# Patient Record
Sex: Female | Born: 2000 | Race: Black or African American | Hispanic: No | Marital: Single | Smoking: Never smoker
Health system: Southern US, Community
[De-identification: ages and names within clinical notes are randomized; demographics above are authoritative.]

## PROBLEM LIST (undated history)

## (undated) ENCOUNTER — Inpatient Hospital Stay (HOSPITAL_COMMUNITY): Payer: Self-pay

## (undated) ENCOUNTER — Emergency Department (HOSPITAL_COMMUNITY)

## (undated) DIAGNOSIS — B009 Herpesviral infection, unspecified: Secondary | ICD-10-CM

## (undated) DIAGNOSIS — A6000 Herpesviral infection of urogenital system, unspecified: Secondary | ICD-10-CM

## (undated) HISTORY — DX: Herpesviral infection, unspecified: B00.9

---
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
## 2019-01-24 DIAGNOSIS — Z0389 Encounter for observation for other suspected diseases and conditions ruled out: Secondary | ICD-10-CM | POA: Diagnosis not present

## 2019-01-24 DIAGNOSIS — Z3009 Encounter for other general counseling and advice on contraception: Secondary | ICD-10-CM | POA: Diagnosis not present

## 2019-01-24 DIAGNOSIS — Z1388 Encounter for screening for disorder due to exposure to contaminants: Secondary | ICD-10-CM | POA: Diagnosis not present

## 2019-01-24 DIAGNOSIS — Z113 Encounter for screening for infections with a predominantly sexual mode of transmission: Secondary | ICD-10-CM | POA: Diagnosis not present

## 2019-03-12 ENCOUNTER — Other Ambulatory Visit: Payer: Self-pay

## 2019-03-12 ENCOUNTER — Ambulatory Visit (INDEPENDENT_AMBULATORY_CARE_PROVIDER_SITE_OTHER): Payer: Medicaid Other | Admitting: Certified Nurse Midwife

## 2019-03-12 ENCOUNTER — Telehealth: Payer: Self-pay | Admitting: Certified Nurse Midwife

## 2019-03-12 ENCOUNTER — Encounter: Payer: Self-pay | Admitting: Certified Nurse Midwife

## 2019-03-12 ENCOUNTER — Other Ambulatory Visit (HOSPITAL_COMMUNITY)
Admission: RE | Admit: 2019-03-12 | Discharge: 2019-03-12 | Disposition: A | Discharge status: Home / Self Care | Payer: Medicaid Other | Source: Ambulatory Visit | Attending: Certified Nurse Midwife | Admitting: Certified Nurse Midwife

## 2019-03-12 VITALS — BP 113/78 | HR 64 | Ht 66.0 in | Wt 141.9 lb

## 2019-03-12 DIAGNOSIS — N921 Excessive and frequent menstruation with irregular cycle: Secondary | ICD-10-CM | POA: Diagnosis not present

## 2019-03-12 DIAGNOSIS — N9089 Other specified noninflammatory disorders of vulva and perineum: Secondary | ICD-10-CM

## 2019-03-12 DIAGNOSIS — N898 Other specified noninflammatory disorders of vagina: Secondary | ICD-10-CM | POA: Diagnosis not present

## 2019-03-12 MED ORDER — CLINDAMYCIN HCL 300 MG PO CAPS: 300.0000 mg | ORAL_CAPSULE | Freq: Three times a day (TID) | ORAL | 0 refills | Status: AC
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
## 2019-03-12 NOTE — Progress Notes (Signed)
GYN ENCOUNTER NOTE  Subjective:       Theresa Roberson is a 18 y.o. G0P0000 female is here for gynecologic evaluation of the following issues:  1. Breakthrough bleeding on depo x three (3) weeks 2. Intermittent thick, yellow vaginal discharge with odor x two (2) weeks 3. Vaginal bump for the last three (3) months; seen at [facility] and told it was an infected hair follicle  Denies difficulty breathing or respiratory distress, chest pain, abdominal pain, excessive vaginal bleeding, dysuria, and leg pain or swelling.    Gynecologic History  No LMP recorded. Patient has had an injection.  Contraception: Depo-Provera injections  Last Pap: N/A  Obstetric History  OB History  Gravida Para Term Preterm AB Living  0 0 0 0 0 0  SAB TAB Ectopic Multiple Live Births  0 0 0 0 0    Past Medical History:  Diagnosis Date  . Genital herpes 12/2018    No past surgical history on file.  Current Outpatient Medications on File Prior to Visit  Medication Sig Dispense Refill  . acyclovir (ZOVIRAX) 800 MG tablet Take 800 mg by mouth daily.    . medroxyPROGESTERone (DEPO-PROVERA) 150 MG/ML injection Inject into the muscle every 3 (three) months.     No current facility-administered medications on file prior to visit.     No Known Allergies  Social History   Socioeconomic History  . Marital status: Single    Spouse name: Not on file  . Number of children: Not on file  . Years of education: Not on file  . Highest education level: Not on file  Occupational History  . Not on file  Social Needs  . Financial resource strain: Not on file  . Food insecurity    Worry: Not on file    Inability: Not on file  . Transportation needs    Medical: Not on file    Non-medical: Not on file  Tobacco Use  . Smoking status: Never Smoker  . Smokeless tobacco: Never Used  Substance and Sexual Activity  . Alcohol use: Yes    Alcohol/week: 1.0 standard drinks    Types: 1 Glasses of wine per week  .  Drug use: Never  . Sexual activity: Not Currently    Birth control/protection: Injection  Lifestyle  . Physical activity    Days per week: Not on file    Minutes per session: Not on file  . Stress: Not on file  Relationships  . Social Musicianconnections    Talks on phone: Not on file    Gets together: Not on file    Attends religious service: Not on file    Active member of club or organization: Not on file    Attends meetings of clubs or organizations: Not on file    Relationship status: Not on file  . Intimate partner violence    Fear of current or ex partner: Not on file    Emotionally abused: Not on file    Physically abused: Not on file    Forced sexual activity: Not on file  Other Topics Concern  . Not on file  Social History Narrative  . Not on file    Family History  Problem Relation Age of Onset  . Breast cancer Neg Hx   . Ovarian cancer Neg Hx   . Colon cancer Neg Hx     The following portions of the patient's history were reviewed and updated as appropriate: allergies, current medications, past family history,  past medical history, past social history, past surgical history and problem list.  Review of Systems  ROS negative except as noted above. Information obtained from patient.   Objective:   BP 113/78   Pulse 64   Ht 5\' 6"  (1.676 m)   Wt 141 lb 14.4 oz (64.4 kg)   BMI 22.90 kg/m    CONSTITUTIONAL: Well-developed, well-nourished female in no acute distress.   PELVIC:  External Genitalia: Normal except for open draining (clearish white fluid) nickel sized wound  Vagina: Normal, swab collected   MUSCULOSKELETAL: Normal range of motion. No tenderness.  No cyanosis, clubbing, or edema.  Assessment:   1. Vaginal discharge  - Cervicovaginal ancillary only  2. Vulvar lump  - Anaerobic and Aerobic Culture  3. Breakthrough bleeding on depo provera  - Cervicovaginal ancillary only     Plan:   Swabs collected, see orders  Discussed home  treatment measures  Rx Clindamycin, see orders  Reviewed red flag symptoms and when to call  RTC x 3-5 days for follow up or sooner if needed   Diona Fanti, CNM [facility], [facility] 03/12/19 11:51 AM

## 2019-03-12 NOTE — Patient Instructions (Addendum)
Skin Abscess  A skin abscess is an infected area of your skin that contains pus and other material. An abscess can happen in any part of your body. Some abscesses break open (rupture) on their own. Most continue to get worse unless they are treated. The infection can spread deeper into the body and into your blood, which can make you feel sick. A skin abscess is caused by germs that enter the skin through a cut or scrape. It can also be caused by blocked oil and sweat glands or infected hair follicles. This condition is usually treated by:  Draining the pus.  Taking antibiotic medicines.  Placing a warm, wet washcloth over the abscess. Follow these instructions at home: Medicines   Take over-the-counter and prescription medicines only as told by your doctor.  If you were prescribed an antibiotic medicine, take it as told by your doctor. Do not stop taking the antibiotic even if you start to feel better. Abscess care   If you have an abscess that has not drained, place a warm, clean, wet washcloth over the abscess several times a day. Do this as told by your doctor.  Follow instructions from your doctor about how to take care of your abscess. Make sure you: ? Cover the abscess with a bandage (dressing). ? Change your bandage or gauze as told by your doctor. ? Wash your hands with soap and water before you change the bandage or gauze. If you cannot use soap and water, use hand sanitizer.  Check your abscess every day for signs that the infection is getting worse. Check for: ? More redness, swelling, or pain. ? More fluid or blood. ? Warmth. ? More pus or a bad smell. General instructions  To avoid spreading the infection: ? Do not share personal care items, towels, or hot tubs with others. ? Avoid making skin-to-skin contact with other people.  Keep all follow-up visits as told by your doctor. This is important. Contact a doctor if:  You have more redness, swelling, or pain  around your abscess.  You have more fluid or blood coming from your abscess.  Your abscess feels warm when you touch it.  You have more pus or a bad smell coming from your abscess.  You have a fever.  Your muscles ache.  You have chills.  You feel sick. Get help right away if:  You have very bad (severe) pain.  You see red streaks on your skin spreading away from the abscess. Summary  A skin abscess is an infected area of your skin that contains pus and other material.  The abscess is caused by germs that enter the skin through a cut or scrape. It can also be caused by blocked oil and sweat glands or infected hair follicles.  Follow your doctor's instructions on caring for your abscess, taking medicines, preventing infections, and keeping follow-up visits. This information is not intended to replace advice given to you by your health care provider. Make sure you discuss any questions you have with your health care provider. Document Released: 01/12/2008 Document Revised: 11/16/2018 Document Reviewed: 09/08/2017 Elsevier Patient Education  2020 Elsevier Inc. Clindamycin capsules What is this medicine? CLINDAMYCIN (KLIN da MYE sin) is a lincosamide antibiotic. It is used to treat certain kinds of bacterial infections. It will not work for colds, flu, or other viral infections. This medicine may be used for other purposes; ask your health care provider or pharmacist if you have questions. COMMON BRAND NAME(S): Cleocin What  should I tell my health care provider before I take this medicine? They need to know if you have any of these conditions:  kidney disease  liver disease  stomach problems like colitis  an unusual or allergic reaction to clindamycin, lincomycin, or other medicines, foods, dyes like tartrazine or preservatives  pregnant or trying to get pregnant  breast-feeding How should I use this medicine? Take this medicine by mouth with a full glass of water.  Follow the directions on the prescription label. You can take this medicine with food or on an empty stomach. If the medicine upsets your stomach, take it with food. Take your medicine at regular intervals. Do not take your medicine more often than directed. Take all of your medicine as directed even if you think your are better. Do not skip doses or stop your medicine early. Talk to your pediatrician regarding the use of this medicine in children. Special care may be needed. Overdosage: If you think you have taken too much of this medicine contact a poison control center or emergency room at once. NOTE: This medicine is only for you. Do not share this medicine with others. What if I miss a dose? If you miss a dose, take it as soon as you can. If it is almost time for your next dose, take only that dose. Do not take double or extra doses. What may interact with this medicine?  birth control pills  erythromycin  medicines that relax muscles for surgery  rifampin This list may not describe all possible interactions. Give your health care provider a list of all the medicines, herbs, non-prescription drugs, or dietary supplements you use. Also tell them if you smoke, drink alcohol, or use illegal drugs. Some items may interact with your medicine. What should I watch for while using this medicine? Tell your doctor or health care provider if your symptoms do not start to get better or if they get worse. This medicine may cause serious skin reactions. They can happen weeks to months after starting the medicine. Contact your health care provider right away if you notice fevers or flu-like symptoms with a rash. The rash may be red or purple and then turn into blisters or peeling of the skin. Or, you might notice a red rash with swelling of the face, lips or lymph nodes in your neck or under your arms. Do not treat diarrhea with over the counter products. Contact your doctor if you have diarrhea that lasts  more than 2 days or if it is severe and watery. What side effects may I notice from receiving this medicine? Side effects that you should report to your doctor or health care professional as soon as possible:  allergic reactions like skin rash, itching or hives, swelling of the face, lips, or tongue  dark urine  pain on swallowing  rash, fever, and swollen lymph nodes  redness, blistering, peeling or loosening of the skin, including inside the mouth  unusual bleeding or bruising  unusually weak or tired  yellowing of eyes or skin Side effects that usually do not require medical attention (report to your doctor or health care professional if they continue or are bothersome):  diarrhea  itching in the rectal or genital area  joint pain  nausea, vomiting  stomach pain This list may not describe all possible side effects. Call your doctor for medical advice about side effects. You may report side effects to FDA at 1-800-FDA-1088. Where should I keep my medicine? Keep  out of the reach of children. Store at room temperature between 20 and 25 degrees C (68 and 77 degrees F). Throw away any unused medicine after the expiration date. NOTE: This sheet is a summary. It may not cover all possible information. If you have questions about this medicine, talk to your doctor, pharmacist, or health care provider.  2020 Elsevier/Gold Standard (2018-10-26 12:02:12)

## 2019-03-12 NOTE — Progress Notes (Signed)
Patient c/o vaginal bump that comes and goes x3 months, "I was told it was a hair follicle and it would go away", intermittent thick yellow vaginal d/c with odor x2 weeks, BTB with Depo x3 weeks and will like to have STD testing done today.

## 2019-03-16 ENCOUNTER — Other Ambulatory Visit: Payer: Self-pay

## 2019-03-16 ENCOUNTER — Encounter: Payer: Self-pay | Admitting: Certified Nurse Midwife

## 2019-03-16 ENCOUNTER — Ambulatory Visit (INDEPENDENT_AMBULATORY_CARE_PROVIDER_SITE_OTHER): Payer: Medicaid Other | Admitting: Certified Nurse Midwife

## 2019-03-16 VITALS — BP 109/69 | HR 76 | Ht 66.0 in | Wt 141.7 lb

## 2019-03-16 DIAGNOSIS — L739 Follicular disorder, unspecified: Secondary | ICD-10-CM

## 2019-03-16 DIAGNOSIS — Z09 Encounter for follow-up examination after completed treatment for conditions other than malignant neoplasm: Secondary | ICD-10-CM | POA: Diagnosis not present

## 2019-03-16 LAB — CERVICOVAGINAL ANCILLARY ONLY
Candida vaginitis: NEGATIVE
Chlamydia: POSITIVE — AB
Neisseria Gonorrhea: NEGATIVE
Trichomonas: NEGATIVE

## 2019-03-16 LAB — ANAEROBIC AND AEROBIC CULTURE

## 2019-03-16 NOTE — Progress Notes (Signed)
Patient here for vaginal bump follow-up, bump has decreased in size, taking Clindamycin and feels better.

## 2019-03-16 NOTE — Patient Instructions (Signed)

## 2019-03-16 NOTE — Progress Notes (Signed)
GYN ENCOUNTER NOTE  Subjective:       Theresa Roberson is a 18 y.o. G0P0000 female here for follow up.   Reports "bump" decreasing in size. Feels better after starting Clindamycin.   Denies difficulty breathing or respiratory distress, chest pain, abdominal pain, excessive vaginal bleeding, dysuria, and leg pain or swelling.    Gynecologic History  No LMP recorded. Patient has had an injection.  Contraception: Depo-Provera injections  Last Pap: N/A   Obstetric History OB History  Gravida Para Term Preterm AB Living  0 0 0 0 0 0  SAB TAB Ectopic Multiple Live Births  0 0 0 0 0    Past Medical History:  Diagnosis Date  . Genital herpes 12/2018    No past surgical history on file.  Current Outpatient Medications on File Prior to Visit  Medication Sig Dispense Refill  . acyclovir (ZOVIRAX) 800 MG tablet Take 800 mg by mouth daily.    . clindamycin (CLEOCIN) 300 MG capsule Take 1 capsule (300 mg total) by mouth 3 (three) times daily for 10 days. 30 capsule 0  . medroxyPROGESTERone (DEPO-PROVERA) 150 MG/ML injection Inject into the muscle every 3 (three) months.     No current facility-administered medications on file prior to visit.     No Known Allergies  Social History   Socioeconomic History  . Marital status: Single    Spouse name: Not on file  . Number of children: Not on file  . Years of education: Not on file  . Highest education level: Not on file  Occupational History  . Not on file  Social Needs  . Financial resource strain: Not on file  . Food insecurity    Worry: Not on file    Inability: Not on file  . Transportation needs    Medical: Not on file    Non-medical: Not on file  Tobacco Use  . Smoking status: Never Smoker  . Smokeless tobacco: Never Used  Substance and Sexual Activity  . Alcohol use: Yes    Alcohol/week: 1.0 standard drinks    Types: 1 Glasses of wine per week  . Drug use: Never  . Sexual activity: Not Currently    Birth  control/protection: Injection  Lifestyle  . Physical activity    Days per week: Not on file    Minutes per session: Not on file  . Stress: Not on file  Relationships  . Social Musicianconnections    Talks on phone: Not on file    Gets together: Not on file    Attends religious service: Not on file    Active member of club or organization: Not on file    Attends meetings of clubs or organizations: Not on file    Relationship status: Not on file  . Intimate partner violence    Fear of current or ex partner: Not on file    Emotionally abused: Not on file    Physically abused: Not on file    Forced sexual activity: Not on file  Other Topics Concern  . Not on file  Social History Narrative  . Not on file    Family History  Problem Relation Age of Onset  . Breast cancer Neg Hx   . Ovarian cancer Neg Hx   . Colon cancer Neg Hx     The following portions of the patient's history were reviewed and updated as appropriate: allergies, current medications, past family history, past medical history, past social history, past surgical history  and problem list.  Review of Systems  ROS negative except as noted above. Information obtained from patient.   Objective:   BP 109/69   Pulse 76   Ht 5\' 6"  (1.676 m)   Wt 141 lb 11.2 oz (64.3 kg)   BMI 22.87 kg/m    CONSTITUTIONAL: Well-developed, well-nourished female in no acute distress.   PELVIC:  External Genitalia: Healing pencil eraser sized wound with clear drainage   MUSCULOSKELETAL: Normal range of motion. No tenderness.  No cyanosis, clubbing, or edema.  Recent Results (from the past 2160 hour(s))  Anaerobic and Aerobic Culture     Status: None   Collection Time: 03/12/19  2:00 PM   Specimen: Vulva; Sterile Swab   VU  Result Value Ref Range   Anaerobic Culture Final report    Result 1 Comment     Comment: No anaerobic growth in 72 hours.   Aerobic Culture Final report    Result 1 Routine flora     Comment: Light growth     Assessment:   1. Folliculitis   2. Follow up   Plan:   Lab results reviewed.   Continue home treatment measures.   RTC as needed.    Diona Fanti, CNM [facility], [facility] 03/16/19 8:06 PM

## 2019-03-17 ENCOUNTER — Other Ambulatory Visit: Payer: Self-pay | Admitting: Certified Nurse Midwife

## 2019-03-17 DIAGNOSIS — A749 Chlamydial infection, unspecified: Secondary | ICD-10-CM

## 2019-03-17 DIAGNOSIS — B9689 Other specified bacterial agents as the cause of diseases classified elsewhere: Secondary | ICD-10-CM

## 2019-03-17 MED ORDER — AZITHROMYCIN 500 MG PO TABS: 1000.0000 mg | ORAL_TABLET | Freq: Once | ORAL | 1 refills | Status: AC

## 2019-03-17 NOTE — Progress Notes (Signed)
Vaginal swab positive for BV and Chlamydia. Rx: Azithromycin, see orders. Refill for partner, no sex for one (1) week. RTC x 1 month for TOC or sooner if need. The clindamycin you are already taking should treat the BV. Encourage to active Glendo. Thanks, JML

## 2019-03-19 ENCOUNTER — Other Ambulatory Visit: Payer: Self-pay

## 2019-03-19 ENCOUNTER — Telehealth: Payer: Self-pay | Admitting: Certified Nurse Midwife

## 2019-03-19 ENCOUNTER — Ambulatory Visit: Payer: Medicaid Other

## 2019-03-19 MED ORDER — AZITHROMYCIN 500 MG PO TABS: 500.0000 mg | ORAL_TABLET | Freq: Once | ORAL | 1 refills | Status: AC

## 2019-03-19 NOTE — Telephone Encounter (Signed)
See result notes for response.

## 2019-03-19 NOTE — Telephone Encounter (Signed)
Communicable disease report faxed to [facility].

## 2019-03-19 NOTE — Telephone Encounter (Signed)
Pt stated she had STD testing last week and she is checking the status of her results. Pt is requesting a call back if possible. Please advise.

## 2019-03-19 NOTE — Progress Notes (Deleted)
Date last pap: underage. Last Depo-Provera: ***. Side Effects if any: ***. Serum HCG indicated? ***. Depo-Provera 150 mg IM given by: F Mervil Wacker,LPN. Next appointment due October 26-June 18, 2019.

## 2019-03-23 ENCOUNTER — Ambulatory Visit (INDEPENDENT_AMBULATORY_CARE_PROVIDER_SITE_OTHER): Payer: Medicaid Other | Admitting: Certified Nurse Midwife

## 2019-03-23 ENCOUNTER — Other Ambulatory Visit: Payer: Self-pay

## 2019-03-23 VITALS — BP 108/71 | HR 80 | Ht 66.0 in | Wt 142.0 lb

## 2019-03-23 DIAGNOSIS — Z3042 Encounter for surveillance of injectable contraceptive: Secondary | ICD-10-CM | POA: Diagnosis not present

## 2019-03-23 LAB — POCT URINE PREGNANCY: Preg Test, Ur: NEGATIVE

## 2019-03-23 MED ORDER — MEDROXYPROGESTERONE ACETATE 150 MG/ML IM SUSP
150.0000 mg | Freq: Once | INTRAMUSCULAR | Status: AC
  Administered 2019-03-23: 150 mg via INTRAMUSCULAR

## 2019-03-23 NOTE — Progress Notes (Signed)
Date last pap: n/a pt underage. Last Depo-Provera: Marland Kitchen Side Effects if any: none. Serum HCG indicated? n/a. Depo-Provera 150 mg IM given by: F. Berlene Dixson,LPN. Next appointment due Oct 30-Jun 22, 2019. UPT-Neg   BP 108/71   Pulse 80   Ht 5\' 6"  (1.676 m)   Wt 142 lb (64.4 kg)   BMI 22.92 kg/m

## 2019-04-17 ENCOUNTER — Ambulatory Visit (INDEPENDENT_AMBULATORY_CARE_PROVIDER_SITE_OTHER): Payer: Medicaid Other | Admitting: Certified Nurse Midwife

## 2019-04-17 ENCOUNTER — Encounter: Payer: Self-pay | Admitting: Certified Nurse Midwife

## 2019-04-17 ENCOUNTER — Other Ambulatory Visit: Payer: Self-pay

## 2019-04-17 ENCOUNTER — Other Ambulatory Visit (HOSPITAL_COMMUNITY)
Admission: RE | Admit: 2019-04-17 | Discharge: 2019-04-17 | Disposition: A | Discharge status: Home / Self Care | Payer: Medicaid Other | Source: Ambulatory Visit | Attending: Certified Nurse Midwife | Admitting: Certified Nurse Midwife

## 2019-04-17 VITALS — BP 102/63 | HR 66 | Ht 66.0 in | Wt 139.9 lb

## 2019-04-17 DIAGNOSIS — Z8619 Personal history of other infectious and parasitic diseases: Secondary | ICD-10-CM | POA: Insufficient documentation

## 2019-04-17 DIAGNOSIS — L0292 Furuncle, unspecified: Secondary | ICD-10-CM

## 2019-04-17 DIAGNOSIS — L0293 Carbuncle, unspecified: Secondary | ICD-10-CM

## 2019-04-17 MED ORDER — CLINDAMYCIN PHOSPHATE 1 % EX LOTN: TOPICAL_LOTION | Freq: Two times a day (BID) | CUTANEOUS | 0 refills | Status: DC

## 2019-04-17 NOTE — Progress Notes (Signed)
GYN ENCOUNTER NOTE  Subjective:       Theresa Roberson is a 18 y.o. G0P0000 female is here for gynecologic evaluation of the following issues:  1. Needs test of cure-history of chlamydia 2. Recurrence of "vaginal bump" approximately four (4) days ago that has since started draining  Completed antibiotics for infection.   Denies difficulty breathing or respiratory distress, chest pain, abdominal pain, excessive vaginal bleeding, dysuria, and leg pain or swelling.    Gynecologic History  No LMP recorded. Patient has had an injection.  Contraception: Depo-Provera injections  Last Pap: N/A   Obstetric History  OB History  Gravida Para Term Preterm AB Living  0 0 0 0 0 0  SAB TAB Ectopic Multiple Live Births  0 0 0 0 0    Past Medical History:  Diagnosis Date  . Genital herpes 12/2018    No past surgical history on file.  Current Outpatient Medications on File Prior to Visit  Medication Sig Dispense Refill  . acyclovir (ZOVIRAX) 800 MG tablet Take 800 mg by mouth daily.    . medroxyPROGESTERone (DEPO-PROVERA) 150 MG/ML injection Inject into the muscle every 3 (three) months.     No current facility-administered medications on file prior to visit.     No Known Allergies  Social History   Socioeconomic History  . Marital status: Single    Spouse name: Not on file  . Number of children: Not on file  . Years of education: Not on file  . Highest education level: Not on file  Occupational History  . Not on file  Social Needs  . Financial resource strain: Not on file  . Food insecurity    Worry: Not on file    Inability: Not on file  . Transportation needs    Medical: Not on file    Non-medical: Not on file  Tobacco Use  . Smoking status: Never Smoker  . Smokeless tobacco: Never Used  Substance and Sexual Activity  . Alcohol use: Yes    Alcohol/week: 1.0 standard drinks    Types: 1 Glasses of wine per week  . Drug use: Never  . Sexual activity: Not Currently     Birth control/protection: Injection  Lifestyle  . Physical activity    Days per week: Not on file    Minutes per session: Not on file  . Stress: Not on file  Relationships  . Social Musicianconnections    Talks on phone: Not on file    Gets together: Not on file    Attends religious service: Not on file    Active member of club or organization: Not on file    Attends meetings of clubs or organizations: Not on file    Relationship status: Not on file  . Intimate partner violence    Fear of current or ex partner: Not on file    Emotionally abused: Not on file    Physically abused: Not on file    Forced sexual activity: Not on file  Other Topics Concern  . Not on file  Social History Narrative  . Not on file    Family History  Problem Relation Age of Onset  . Breast cancer Neg Hx   . Ovarian cancer Neg Hx   . Colon cancer Neg Hx     The following portions of the patient's history were reviewed and updated as appropriate: allergies, current medications, past family history, past medical history, past social history, past surgical history and problem list.  Review of Systems  ROS negative except as noted above. Information obtained from patient.   Objective:   BP 102/63   Pulse 66   Ht 5\' 6"  (1.676 m)   Wt 139 lb 14.4 oz (63.5 kg)   BMI 22.58 kg/m    CONSTITUTIONAL: Well-developed, well-nourished female in no acute distress.   PELVIC:  External Genitalia: Open pencil eraser sized wound with clear drainage   MUSCULOSKELETAL: Normal range of motion. No tenderness.  No cyanosis, clubbing, or edema.  Assessment:   1. History of chlamydia  - Cervicovaginal ancillary only  2. Carbuncle and furuncle  Plan:   Vaginal swab collected, see orders.   Rx: Clindamycin lotion, see orders.   Reviewed red flag symptoms and when to call.   RTC x 1 week if symptoms worsen or fail to improve.    Diona Fanti, CNM [facility], [facility] 04/17/19 6:32  PM

## 2019-04-17 NOTE — Patient Instructions (Addendum)
Skin Abscess  A skin abscess is an infected area of your skin that contains pus and other material. An abscess can happen in any part of your body. Some abscesses break open (rupture) on their own. Most continue to get worse unless they are treated. The infection can spread deeper into the body and into your blood, which can make you feel sick. A skin abscess is caused by germs that enter the skin through a cut or scrape. It can also be caused by blocked oil and sweat glands or infected hair follicles. This condition is usually treated by:  Draining the pus.  Taking antibiotic medicines.  Placing a warm, wet washcloth over the abscess. Follow these instructions at home: Medicines   Take over-the-counter and prescription medicines only as told by your doctor.  If you were prescribed an antibiotic medicine, take it as told by your doctor. Do not stop taking the antibiotic even if you start to feel better. Abscess care   If you have an abscess that has not drained, place a warm, clean, wet washcloth over the abscess several times a day. Do this as told by your doctor.  Follow instructions from your doctor about how to take care of your abscess. Make sure you: ? Cover the abscess with a bandage (dressing). ? Change your bandage or gauze as told by your doctor. ? Wash your hands with soap and water before you change the bandage or gauze. If you cannot use soap and water, use hand sanitizer.  Check your abscess every day for signs that the infection is getting worse. Check for: ? More redness, swelling, or pain. ? More fluid or blood. ? Warmth. ? More pus or a bad smell. General instructions  To avoid spreading the infection: ? Do not share personal care items, towels, or hot tubs with others. ? Avoid making skin-to-skin contact with other people.  Keep all follow-up visits as told by your doctor. This is important. Contact a doctor if:  You have more redness, swelling, or pain  around your abscess.  You have more fluid or blood coming from your abscess.  Your abscess feels warm when you touch it.  You have more pus or a bad smell coming from your abscess.  You have a fever.  Your muscles ache.  You have chills.  You feel sick. Get help right away if:  You have very bad (severe) pain.  You see red streaks on your skin spreading away from the abscess. Summary  A skin abscess is an infected area of your skin that contains pus and other material.  The abscess is caused by germs that enter the skin through a cut or scrape. It can also be caused by blocked oil and sweat glands or infected hair follicles.  Follow your doctor's instructions on caring for your abscess, taking medicines, preventing infections, and keeping follow-up visits. This information is not intended to replace advice given to you by your health care provider. Make sure you discuss any questions you have with your health care provider. Document Released: 01/12/2008 Document Revised: 11/16/2018 Document Reviewed: 09/08/2017 Elsevier Patient Education  2020 Elsevier Inc. Folliculitis  Folliculitis is inflammation of the hair follicles. Folliculitis most commonly occurs on the scalp, thighs, legs, back, and buttocks. However, it can occur anywhere on the body. What are the causes? This condition may be caused by:  A bacterial infection (common).  A fungal infection.  A viral infection.  Contact with certain chemicals, especially oils and  tars.  Shaving or waxing.  Greasy ointments or creams applied to the skin. Long-lasting folliculitis and folliculitis that keeps coming back may be caused by bacteria. This bacteria can live anywhere on your skin and is often found in the nostrils. What increases the risk? You are more likely to develop this condition if you have:  A weakened immune system.  Diabetes.  Obesity. What are the signs or symptoms? Symptoms of this condition  include:  Redness.  Soreness.  Swelling.  Itching.  Small white or yellow, pus-filled, itchy spots (pustules) that appear over a reddened area. If there is an infection that goes deep into the follicle, these may develop into a boil (furuncle).  A group of closely packed boils (carbuncle). These tend to form in hairy, sweaty areas of the body. How is this diagnosed? This condition is diagnosed with a skin exam. To find what is causing the condition, your health care provider may take a sample of one of the pustules or boils for testing in a lab. How is this treated? This condition may be treated by:  Applying warm compresses to the affected areas.  Taking an antibiotic medicine or applying an antibiotic medicine to the skin.  Applying or bathing with an antiseptic solution.  Taking an over-the-counter medicine to help with itching.  Having a procedure to drain any pustules or boils. This may be done if a pustule or boil contains a lot of pus or fluid.  Having laser hair removal. This may be done to treat long-lasting folliculitis. Follow these instructions at home: Managing pain and swelling   If directed, apply heat to the affected area as often as told by your health care provider. Use the heat source that your health care provider recommends, such as a moist heat pack or a heating pad. ? Place a towel between your skin and the heat source. ? Leave the heat on for 20-30 minutes. ? Remove the heat if your skin turns bright red. This is especially important if you are unable to feel pain, heat, or cold. You may have a greater risk of getting burned. General instructions  If you were prescribed an antibiotic medicine, take it or apply it as told by your health care provider. Do not stop using the antibiotic even if your condition improves.  Check the irritated area every day for signs of infection. Check for: ? Redness, swelling, or pain. ? Fluid or blood. ? Warmth. ? Pus  or a bad smell.  Do not shave irritated skin.  Take over-the-counter and prescription medicines only as told by your health care provider.  Keep all follow-up visits as told by your health care provider. This is important. Get help right away if:  You have more redness, swelling, or pain in the affected area.  Red streaks are spreading from the affected area.  You have a fever. Summary  Folliculitis is inflammation of the hair follicles. Folliculitis most commonly occurs on the scalp, thighs, legs, back, and buttocks.  This condition may be treated by taking an antibiotic medicine or applying an antibiotic medicine to the skin, and applying or bathing with an antiseptic solution.  If you were prescribed an antibiotic medicine, take it or apply it as told by your health care provider. Do not stop using the antibiotic even if your condition improves.  Get help right away if you have new or worsening symptoms.  Keep all follow-up visits as told by your health care provider. This is important.  This information is not intended to replace advice given to you by your health care provider. Make sure you discuss any questions you have with your health care provider. Document Released: 10/04/2001 Document Revised: 03/04/2018 Document Reviewed: 03/04/2018 Elsevier Patient Education  2020 Elsevier Inc. Clindamycin skin solution or gel What is this medicine? CLINDAMYCIN (KLIN da MYE sin) is a lincosamide antibiotic. It is used on the skin to stop the growth of certain bacteria that cause acne. This medicine may be used for other purposes; ask your health care provider or pharmacist if you have questions. COMMON BRAND NAME(S): Cleocin T, Clinda-Derm, Clindagel, ClindaMax What should I tell my health care provider before I take this medicine? They need to know if you have any of these conditions:  diarrhea  inflammatory bowel disease  kidney or liver disease  stomach problems like colitis   an unusual or allergic reaction to clindamycin, lincomycin, other medicines, foods, dyes or preservatives  pregnant or trying to get pregnant  breast-feeding How should I use this medicine? This medicine is for external use only. Wash hands before and after use. Wash affected area and gently pat dry. Apply a thin layer of this medicine to the affected area as often as prescribed by your doctor or health care professional. Do not use skin products near the eyes, nose, or mouth. If you do get any in your eyes rinse out with plenty of cool tap water. Do not use your medicine more often than directed. Talk to your pediatrician regarding the use of this medicine in children. Special care may be needed. While this medicine may be prescribed for children as young as 12 years for selected conditions, precautions do apply. Overdosage: If you think you have taken too much of this medicine contact a poison control center or emergency room at once. NOTE: This medicine is only for you. Do not share this medicine with others. What if I miss a dose? If you miss a dose, use it as soon as you can. If it is almost time for your next dose, use only that dose. Do not use double or extra doses. What may interact with this medicine?  medicated cosmetics, including coverup preparations  other acne products including benzoyl peroxide, salicylic acid, or tretinoin  skin care products that have a high alcohol content (some shaving creams, lotions, or after shave lotion)  some skin cleansers or medicated soaps This list may not describe all possible interactions. Give your health care provider a list of all the medicines, herbs, non-prescription drugs, or dietary supplements you use. Also tell them if you smoke, drink alcohol, or use illegal drugs. Some items may interact with your medicine. What should I watch for while using this medicine? Your acne should start to get better within about 6 weeks. Complete  improvement may take longer. Tell your doctor or health care professional if you do not see any improvement. Your skin may get very dry and scale or peel. Let your doctor or health care professional know if this happens. Do not use any soothing cream or ointment without advice. What side effects may I notice from receiving this medicine? Side effects that you should report to your doctor or health care professional as soon as possible:  allergic reactions like skin rash, itching or hives, swelling of the face, lips, or tongue  diarrhea that is watery or severe  pain on swallowing  stomach pain or cramps  unusual bleeding or bruising Side effects that usually do not  require medical attention (report to your doctor or health care professional if they continue or are bothersome):  dry skin  nausea, vomiting This list may not describe all possible side effects. Call your doctor for medical advice about side effects. You may report side effects to FDA at 1-800-FDA-1088. Where should I keep my medicine? Keep out of the reach of children. Store at room temperature between 20 and 25 degrees C (68 and 77 degrees F). Do not freeze. Throw away any unused medicine after the expiration date. NOTE: This sheet is a summary. It may not cover all possible information. If you have questions about this medicine, talk to your doctor, pharmacist, or health care provider.  2020 Elsevier/Gold Standard (2013-03-01 16:17:10)

## 2019-04-17 NOTE — Progress Notes (Signed)
Patient here for TOC.  Patient c/o recurrent vaginal bump that popped 4 days ago, vaginal bump still draining, c/o thin yellow vaginal discharge and irritation x1 week

## 2019-04-18 ENCOUNTER — Telehealth: Payer: Self-pay | Admitting: Certified Nurse Midwife

## 2019-04-18 NOTE — Telephone Encounter (Signed)
The patient called and stated that she is having some issues with her medication and was advised by her pharmacy to contact the office to speed up the process. Pt requesting a call back. Please advise.

## 2019-04-19 ENCOUNTER — Telehealth: Payer: Self-pay

## 2019-04-19 NOTE — Telephone Encounter (Signed)
Returned patients call- patient states [facility] told her they sent something to [place] explaining what was needed. Nothing was sent via fax. [facility] contacted.

## 2019-04-19 NOTE — Telephone Encounter (Signed)
PA denied by [employer]. Patient needs to fail on 2 preferred agents. Message sent to ML

## 2019-04-19 NOTE — Telephone Encounter (Signed)
Attempted to contact patient to let her know ML was changing the medicine due to insurance denying the PA. Unable to leave a message due to mailbox not being setup.

## 2019-04-20 ENCOUNTER — Other Ambulatory Visit: Payer: Self-pay

## 2019-04-20 ENCOUNTER — Other Ambulatory Visit (INDEPENDENT_AMBULATORY_CARE_PROVIDER_SITE_OTHER): Payer: Medicaid Other | Admitting: Certified Nurse Midwife

## 2019-04-20 DIAGNOSIS — B379 Candidiasis, unspecified: Secondary | ICD-10-CM

## 2019-04-20 DIAGNOSIS — A549 Gonococcal infection, unspecified: Secondary | ICD-10-CM

## 2019-04-20 LAB — CERVICOVAGINAL ANCILLARY ONLY
Bacterial vaginitis: NEGATIVE
Candida vaginitis: POSITIVE — AB
Chlamydia: NEGATIVE
Neisseria Gonorrhea: POSITIVE — AB
Trichomonas: NEGATIVE

## 2019-04-20 MED ORDER — AZITHROMYCIN 500 MG PO TABS: 1000.0000 mg | ORAL_TABLET | Freq: Once | ORAL | 1 refills | Status: AC

## 2019-04-20 MED ORDER — CLINDAMYCIN HCL 300 MG PO CAPS: 300.0000 mg | ORAL_CAPSULE | Freq: Two times a day (BID) | ORAL | 0 refills | Status: DC

## 2019-04-20 NOTE — Telephone Encounter (Signed)
Rx Clindamycin 300 mg PO BID x 10 days, dispense 20, no refills. Thanks, JML

## 2019-04-23 ENCOUNTER — Telehealth: Payer: Self-pay | Admitting: Certified Nurse Midwife

## 2019-04-23 ENCOUNTER — Other Ambulatory Visit: Payer: Self-pay

## 2019-04-23 NOTE — Telephone Encounter (Signed)
The patient called stating that she needs prior authorization for her medication. The patient stated that she has been calling since last week to check the status of her provider finishing the process. Pt requesting call back. Pt was informed prior authorization for medication can take up to 72 hours at the most. Pt voiced understanding. Please advise.

## 2019-04-23 NOTE — Telephone Encounter (Signed)
Called and spoke with patient, aware Clindamycin capsules were sent to pharmacy on file 9/11.  Patient verbalized understanding.

## 2019-04-24 NOTE — Telephone Encounter (Signed)
Pt called and lvm in regards to medication issue. Issue resolved by provider. Thank you.

## 2019-05-03 ENCOUNTER — Encounter: Payer: Self-pay | Admitting: Licensed Clinical Social Worker

## 2019-05-03 ENCOUNTER — Ambulatory Visit: Payer: Medicaid Other | Admitting: Licensed Clinical Social Worker

## 2019-05-03 ENCOUNTER — Other Ambulatory Visit: Payer: Self-pay

## 2019-05-03 DIAGNOSIS — F4323 Adjustment disorder with mixed anxiety and depressed mood: Secondary | ICD-10-CM

## 2019-05-03 NOTE — Progress Notes (Signed)
Counselor Initial Adult Exam  Name: Theresa Roberson Date: 05/03/2019 MRN: [id number] DOB: 07-09-2001 PCP: Pediatrics, [facility]  Time spent: 1 hour  A biopsychosocial was completed on the Patient. Background information and current concerns were obtained during an intake in the office with the [facility] Department clinician, Leanna Battles, LCSW.  Contact information and confidentiality was discussed and appropriate consents were signed.    Reason for Visit /Presenting Problem:  Patient presents with concerns of distress/depressed mood and anxiety due to recent (April) diagnosis of Herpes. Patient reports that not only did she learn she had this STI, she was betrayed by a guy that she loved and care about, and her sister whom she confided in told all of their friends and there are people asking her about it. (PHQ-9 = 7 GAD-7 = 10).  Patient also reports "Daddy issues". She reports that she thinks she has had some depression related to her father for a long time. She shares that her father is in her life, but the don't have the close relationship that she desires.   Mental Status Exam:    Appearance:   Casual     Behavior:  Appropriate and Sharing  Motor:  Normal  Speech/Language:   Normal Rate  Affect:  Depressed  Mood:  depressed and sad  Thought process:  normal  Thought content:    WNL  Sensory/Perceptual disturbances:    WNL  Orientation:  oriented to person, place and time/date  Attention:  Good  Concentration:  Good  Memory:  WNL  Fund of knowledge:   Good  Insight:    Good  Judgment:   Good  Impulse Control:  Good   Reported Symptoms:  Anhedonia, Sleep disturbance, Isolation and withdrawal, Fatigue and depressed mood, anxiety, anxious thought patterns  Risk Assessment: Danger to Self:  No Self-injurious Behavior: No Danger to Others: No Duty to Warn:no Physical Aggression / Violence:No  Access to Firearms a concern: No  Gang Involvement:No  Patient / guardian  was educated about steps to take if suicide or homicide risk level increases between visits: yes While future psychiatric events cannot be accurately predicted, the patient does not currently require acute inpatient psychiatric care and does not currently meet [facility] involuntary commitment criteria.  Substance Abuse History: Current substance abuse: Yes  marijuana, but has recently decreased use.      Past Psychiatric History:   No previous psychological problems have been observed. And patient denies any family history of mental health issues.  History of Psych Hospitalization: No   Abuse History: Victim of No. Report needed: No. Victim of Neglect:No. Perpetrator of No  Witness / Exposure to Domestic Violence: No   Protective Services Involvement: No  Witness to [employer] Violence:  No   Family History:  Family History  Problem Relation Age of Onset  . Breast cancer Neg Hx   . Ovarian cancer Neg Hx   . Colon cancer Neg Hx     Social History:  Social History   Socioeconomic History  . Marital status: Single    Spouse name: Not on file  . Number of children: 0  . Years of education: 38  . Highest education level: High school graduate  Occupational History  . Not on file  Social Needs  . Financial resource strain: Not on file  . Food insecurity    Worry: Not on file    Inability: Not on file  . Transportation needs    Medical: Not on file  Non-medical: Not on file  Tobacco Use  . Smoking status: Never Smoker  . Smokeless tobacco: Never Used  Substance and Sexual Activity  . Alcohol use: Yes    Alcohol/week: 1.0 standard drinks    Types: 1 Glasses of wine per week  . Drug use: Never  . Sexual activity: Not Currently    Birth control/protection: Injection  Lifestyle  . Physical activity    Days per week: Not on file    Minutes per session: Not on file  . Stress: Not on file  Relationships  . Social connections    Talks on phone: More than three  times a week    Gets together: More than three times a week    Attends religious service: Not on file    Active member of club or organization: No    Attends meetings of clubs or organizations: Never    Relationship status: Never married  Other Topics Concern  . Not on file  Social History Narrative   Patient lives with her mom and her two younger brothers. She has a very close relationship with her mom and god father. Her dad is in her life but she doesn't have the close relationship she desires to have with him. Patient has a lot of supportive friendships. She has one best friend and was best friends with one of her sisters, but they are no longer talking at this time.     Living situation: the patient lives with their family, including her mom and her two younger brothers   Sexual Orientation:  Straight  Relationship Status: single   Garment/textile technologist; friends parents  Museum/gallery curator Stress:  No   Income/Employment/Disability: Employment  Armed forces logistics/support/administrative officer: No   Educational History: Education: student currently attending [employer]   Religion/Sprituality/World View:   Christian   Any cultural differences that may affect / interfere with treatment:  not applicable   Recreation/Hobbies: NA   Stressors:Other: being diagnosed with herpes, relationship with dad  Strengths:  Supportive Relationships, Family, Able to Communicate Effectively and stable housing, own transportation, attending college and working   Barriers:  No    Legal History: Pending legal issue / charges: The patient has no significant history of legal issues. History of legal issue / charges: No  Medical History/Surgical History:reviewed Past Medical History:  Diagnosis Date  . Genital herpes 12/2018    No past surgical history on file.  Medications: Current Outpatient Medications  Medication Sig Dispense Refill  . acyclovir (ZOVIRAX) 800 MG tablet Take 800 mg by mouth daily.    . clindamycin (CLEOCIN T) 1 %  lotion Apply topically 2 (two) times daily. 60 mL 0  . clindamycin (CLEOCIN) 300 MG capsule Take 1 capsule (300 mg total) by mouth 2 (two) times daily. For ten days 20 capsule 0  . medroxyPROGESTERone (DEPO-PROVERA) 150 MG/ML injection Inject into the muscle every 3 (three) months.     No current facility-administered medications for this visit.     No Known Allergies  Ms. Grainne Knights is a 18 year old female with no reported history of mental health diagnosis. Patient currently presents with depressive symptoms and anxiety that developed due to recent stressors. She reports depressed mood, anhedonia, sleep disturbance, low energy, difficulties concentrating, and passive suicidal ideations. Although patient endorses these vague suicidal ideations, she denies any current plan, intent, or means to harm herself. She also describes feeling anxious and worried. (PHQ-9 = 7 GAD-7 = 10). Patient reports that these symptoms impact her functioning in  multiple life domains.   Due to the above symptoms and patient's reported history, patient is diagnosed with Adjustment Disorder, With mixed anxiety and depressed mood. Continued mental health treatment is needed to address patient's symptoms and monitor her safety and stability. Patient is recommended for continued outpatient therapy to reduce her symptoms and improve her coping strategies.    There is no acute risk for suicide or violence at this time.  While future psychiatric events cannot be accurately predicted, the patient does not require acute inpatient psychiatric care and [facility] not [facility] criteria.   Diagnoses:    ICD-10-CM   1. Adjustment disorder with mixed anxiety and depressed mood  F43.23     Plan of Care:  Goal to understand issues, feeling more self love, move past end of relationships with ex, getting over issues with dad  Provided psychoeducation on CBTs.  Patient likely to benefit from CBTs  to increase behaviors aligning with values, increase self-care, including sleep hygiene, address unhelpful thinking patterns/beliefs about self and others, and increase coping skills to manage emotions. Patient is in agreement with weekly Zoom sessions at this time.   Interpreter used: NA   Kathreen CosierAmanda Stacy Sailer, LCSW

## 2019-05-09 ENCOUNTER — Ambulatory Visit: Payer: Medicaid Other | Admitting: Licensed Clinical Social Worker

## 2019-05-09 DIAGNOSIS — F4323 Adjustment disorder with mixed anxiety and depressed mood: Secondary | ICD-10-CM

## 2019-05-09 NOTE — Progress Notes (Signed)
Counselor/Therapist Progress Note  Patient ID: Theresa Roberson, MRN: [id number],    Date: 05/09/2019  Time Spent: 50 minutes   Treatment Type: Psychotherapy  Reported Symptoms: Anhedonia, Isolation and withdrawal and low mood, mild anxious thoughts  Mental Status Exam:  Appearance:   Casual     Behavior:  Appropriate and Sharing  Motor:  Normal  Speech/Language:   Normal Rate  Affect:  Appropriate  Mood:  normal  Thought process:  normal  Thought content:    WNL  Sensory/Perceptual disturbances:    WNL  Orientation:  oriented to person, place and time/date  Attention:  Good  Concentration:  Good  Memory:  WNL  Fund of knowledge:   Good  Insight:    Good  Judgment:   Good  Impulse Control:  Good   Risk Assessment: Danger to Self:  No Self-injurious Behavior: No Danger to Others: No Duty to Warn:no Physical Aggression / Violence:No  Access to Firearms a concern: No  Gang Involvement:No   Subjective: Patient was engaged and cooperative throughout the session using time effectively to discuss thoughts, feelings, and treatment planning.  Patient voices continued motivation for treatment and understanding of  . Patient is likely to benefit from future treatment because she remains motivated to decrease mood and anxiety symptoms.    Interventions: Cognitive Behavioral Therapy and Motivational Interviewing  Established psychological safety.  Checked in with patient and reviewed previous session, including assessment and goal of treatment. Reviewed CBTs. Explored patient's goal of treatment and worked collaboratively to develop CBTs treatment plan. Used MI to assist patient in developing plan to begin exercising. Discussed the importance of engagement pleasurable activities - encouraging patient to engage in positive peer relationships. Provided support through active listening, validation of feelings, and highlighted patient's strengths.  Diagnosis:   ICD-10-CM   1. Adjustment  disorder with mixed anxiety and depressed mood  F43.23     Plan: Goal to understand issues, feeling more self love, move past end of relationships with ex, getting over issues with dad.  Treatment Target: Understand the relationship between thoughts, emotions, and behaviors  - Psychoeducation on CBT model   - Teach the connection between thoughts, emotions, and behaviors  - Understand thoughts, emotions, and behaviors  Treatment Target: Increase realistic balanced thinking  - Explore patient's thoughts, beliefs, automatic thoughts, assumptions  - Identify and replace thinking that leads to depression - Process distress and allow for emotional release  - Questioning and challenging thoughts - Cognitive reappraisal   Treatment Target: Increase pleasurable and meaningful activities - Behavioral activation      - Provide psychoeducation on Behavior Activation  - Values clarification  - Identify activities/goals aligning with values - Discuss barriers and problem solve     Interpreter used: NA   Milton Ferguson, LCSW

## 2019-05-21 ENCOUNTER — Ambulatory Visit: Payer: Medicaid Other | Admitting: Licensed Clinical Social Worker

## 2019-05-29 ENCOUNTER — Ambulatory Visit: Payer: Medicaid Other | Admitting: Licensed Clinical Social Worker

## 2019-05-29 DIAGNOSIS — F4323 Adjustment disorder with mixed anxiety and depressed mood: Secondary | ICD-10-CM

## 2019-05-29 NOTE — Progress Notes (Signed)
Counselor/Therapist Progress Note  Patient ID: Theresa Roberson, MRN: [id number],    Date: 05/29/2019  Time Spent: 45 minutes   Treatment Type: Psychotherapy  Reported Symptoms: Isolation and withdrawal, Fatigue and some imporvement in mood since last session. Increase in marijuana usage.   Mental Status Exam:  Appearance:   Casual     Behavior:  Appropriate and Sharing  Motor:  Normal  Speech/Language:   Normal Rate  Affect:  Appropriate  Mood:  euthymic  Thought process:  normal  Thought content:    WNL  Sensory/Perceptual disturbances:    WNL  Orientation:  oriented to person, place and time/date  Attention:  Good  Concentration:  Good  Memory:  WNL  Fund of knowledge:   Good  Insight:    Good  Judgment:   Good  Impulse Control:  Good   Risk Assessment: Danger to Self:  No Self-injurious Behavior: No Danger to Others: No Duty to Warn:no Physical Aggression / Violence:No  Access to Firearms a concern: No  Gang Involvement:No   Subjective: Patient was engaged and cooperative throughout the session using time effectively to discuss thoughts and feelings. Patient voices continued motivation for treatment and understanding of mood and anxiety issues related to challenging life events. Patient is in agreement with treatment plan and is likely to benefit from future treatment because she remains motivated to decrease mood and anxiety symptoms and reports some  benefit of sessions in addressing these symptoms.    Interventions: Cognitive Behavioral Therapy and Motivational Interviewing  Established psychological safety. Checked in with patient and discussed current mood and psychosocial stressors. Used MI to discuss patient's increase in marijuana usage, providing psychoeducation on the negative effects of use, and rolling with resitance. Engaged patient in processing mood improvement due to increased positive behaviors, including engaging in activities with positive peers, with  mom, and interaction with her dad. Checked in on follow through with exercise, discussed the benefits of exercise and encouraged patient to establish a plan. Discussed values in regards to romantic relationships.  Engaged patient in reviewing treatment plan. Provided support through active listening, validation of feelings, and highlighted patient's strengths.  Diagnosis:   ICD-10-CM   1. Adjustment disorder with mixed anxiety and depressed mood  F43.23     Plan: Goal to understand issues, feeling more self love, move past end of relationships with ex, getting over issues with dad.  Treatment Target: Understand the relationship between thoughts, emotions, and behaviors   Psychoeducation on CBT model    Teach the connection between thoughts, emotions, and behaviors   Understand thoughts, emotions, and behaviors  Treatment Target: Increase realistic balanced thinking   Explore patient's thoughts, beliefs, automatic thoughts, assumptions   Identify and replace thinking that leads to depression  Process distress and allow for emotional release   Questioning and challenging thoughts  Cognitive reappraisal   Treatment Target: Increase pleasurable and meaningful activities - Behavioral activation    Provide psychoeducation on Behavior Activation   Values clarification   Identify activities/goals aligning with values  Discuss barriers and problem solve   Interpreter used: NA   Milton Ferguson, LCSW
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
## 2019-06-08 ENCOUNTER — Ambulatory Visit: Payer: Medicaid Other

## 2019-06-12 ENCOUNTER — Ambulatory Visit: Payer: Medicaid Other | Admitting: Licensed Clinical Social Worker

## 2019-06-15 ENCOUNTER — Ambulatory Visit: Payer: Medicaid Other

## 2019-06-21 ENCOUNTER — Telehealth: Payer: Self-pay | Admitting: Licensed Clinical Social Worker

## 2019-06-21 NOTE — Telephone Encounter (Signed)
Attempted call to follow up on missed appointment. VM is not set up. No further follow up will be made at this time.

## 2019-06-21 NOTE — Telephone Encounter (Signed)
-----   Message from Milton Ferguson, [place] sent at 06/12/2019  1:51 PM EST ----- Regarding: follow up with patient due to no show DNKA. Call to follow up.

## 2019-06-25 ENCOUNTER — Ambulatory Visit: Payer: Medicaid Other | Admitting: Licensed Clinical Social Worker

## 2019-06-25 DIAGNOSIS — F4323 Adjustment disorder with mixed anxiety and depressed mood: Secondary | ICD-10-CM

## 2019-06-25 NOTE — Progress Notes (Signed)
Counselor/Therapist Progress Note  Patient ID: Theresa Roberson, MRN: [id number],    Date: 06/25/2019  Time Spent: 20 minutes   Treatment Type: Psychotherapy  Reported Symptoms: overall mood improvement with infrequent times of low mood and anxious thoughts secondary to recent stressor event   Mental Status Exam:  Appearance:   NA     Behavior:  Appropriate and Sharing  Motor:  Normal  Speech/Language:   Normal Rate  Affect:  Appropriate  Mood:  normal  Thought process:  normal  Thought content:    WNL  Sensory/Perceptual disturbances:    WNL  Orientation:  oriented to person, place, time/date and situation  Attention:  Good  Concentration:  Good  Memory:  WNL  Fund of knowledge:   Good  Insight:    Good  Judgment:   Good  Impulse Control:  Good   Risk Assessment: Danger to Self:  No Self-injurious Behavior: No Danger to Others: No Duty to Warn:no Physical Aggression / Violence:No  Access to Firearms a concern: No  Gang Involvement:No   Subjective: Patient was engaged and cooperative throughout the session using time effectively to discuss thoughts and feelings. Patient voices continued motivation for treatment and understanding of mood and anxiety issues related to stressors. Patient is likely to benefit from future treatment because she remains motivated to manage symptoms and reports benefit of regular sessions in addressing mood and anxiety related symptoms.   Interventions: Cognitive Behavioral Therapy  Established psychological safety. Provided supportive space encouraging emotional release and processing of current psychosocial stressors. Explored patient's perception of difficulties with coworker, identifying origins of these feelings and validating her feelings. Discussed patient's desire to live within her values and ways to accept and manage feelings of anger and anxiety while continuing to act consistent with her values. Encouraged continued self-care, including  positive social interactions, and continued regular workout schedule. Provided support through active listening, validation of feelings, and highlighted patient's strengths.   Diagnosis:   ICD-10-CM   1. Adjustment disorder with mixed anxiety and depressed mood  F43.23     Plan: Goal to understand issues, feeling more self love, move past end of relationships with ex, getting over issues with dad.  Treatment Target: Understand the relationship between thoughts, emotions, and behaviors   Psychoeducation on CBT model   Teach the connection between thoughts, emotions, and behaviors   Understand thoughts, emotions, and behaviors  Treatment Target: Increase realistic balanced thinking   Explore patient's thoughts, beliefs, automatic thoughts, assumptions   Identify and replace thinking that leads to depression  Process distress and allow for emotional release   Questioning and challenging thoughts  Cognitive reappraisal   Treatment Target: Increasepleasurable and meaningfulactivities -Behavioral activation                                     Provide psychoeducation on Behavior Activation   Values clarification   Identify activities/goals aligning with values  Discuss barriers and problem solve  Interpreter used: NA   Milton Ferguson, LCSW

## 2019-06-28 ENCOUNTER — Ambulatory Visit (LOCAL_COMMUNITY_HEALTH_CENTER): Payer: Medicaid Other | Admitting: Advanced Practice Midwife

## 2019-06-28 ENCOUNTER — Encounter: Payer: Self-pay | Admitting: Advanced Practice Midwife

## 2019-06-28 ENCOUNTER — Other Ambulatory Visit: Payer: Self-pay

## 2019-06-28 VITALS — BP 106/66 | Ht 65.0 in | Wt 138.0 lb

## 2019-06-28 DIAGNOSIS — N760 Acute vaginitis: Secondary | ICD-10-CM

## 2019-06-28 DIAGNOSIS — B9689 Other specified bacterial agents as the cause of diseases classified elsewhere: Secondary | ICD-10-CM | POA: Diagnosis not present

## 2019-06-28 DIAGNOSIS — Z1388 Encounter for screening for disorder due to exposure to contaminants: Secondary | ICD-10-CM | POA: Diagnosis not present

## 2019-06-28 DIAGNOSIS — Z3042 Encounter for surveillance of injectable contraceptive: Secondary | ICD-10-CM

## 2019-06-28 DIAGNOSIS — Z3009 Encounter for other general counseling and advice on contraception: Secondary | ICD-10-CM | POA: Diagnosis not present

## 2019-06-28 DIAGNOSIS — Z113 Encounter for screening for infections with a predominantly sexual mode of transmission: Secondary | ICD-10-CM | POA: Diagnosis not present

## 2019-06-28 DIAGNOSIS — Z30013 Encounter for initial prescription of injectable contraceptive: Secondary | ICD-10-CM | POA: Diagnosis not present

## 2019-06-28 DIAGNOSIS — B009 Herpesviral infection, unspecified: Secondary | ICD-10-CM | POA: Insufficient documentation

## 2019-06-28 DIAGNOSIS — Z0389 Encounter for observation for other suspected diseases and conditions ruled out: Secondary | ICD-10-CM | POA: Diagnosis not present

## 2019-06-28 LAB — WET PREP FOR TRICH, YEAST, CLUE
Trichomonas Exam: NEGATIVE
Yeast Exam: NEGATIVE

## 2019-06-28 MED ORDER — MEDROXYPROGESTERONE ACETATE 150 MG/ML IM SUSP
150.0000 mg | Freq: Once | INTRAMUSCULAR | Status: AC
  Administered 2019-06-28: 150 mg via INTRAMUSCULAR

## 2019-06-28 MED ORDER — METRONIDAZOLE 500 MG PO TABS: 500.0000 mg | ORAL_TABLET | Freq: Two times a day (BID) | ORAL | 0 refills | Status: AC

## 2019-06-28 NOTE — Progress Notes (Signed)
Pt here for Depo and STD screening and desires blood work for HIV and syphillis. Pt reports that she got Depo ~3 months ago at [facility].Ronny Bacon, RN

## 2019-06-28 NOTE — Progress Notes (Signed)
   [facility] problem visit  [place] Department  Subjective:  Theresa Roberson is a 18 y.o.SBF nullip nonsmoker being seen today for DMPA and STD screen  Chief Complaint  Patient presents with  . Contraception    depo  . SEXUALLY TRANSMITTED DISEASE    STD screening    HPI Last DMPA at [facility] "3 months ago".  Last sex 06/24/19 without condom.  Last physical 06/30/2018.  C/o fishy increased yellow-brown d/c after last sex with vaginal irritation  Does the patient have a current or past history of drug use? No   No components found for: HCV]   Health Maintenance Due  Topic Date Due  . HIV Screening  12/15/2014  . TETANUS/TDAP  12/15/2018  . INFLUENZA VACCINE  03/10/2019    ROS  The following portions of the patient's history were reviewed and updated as appropriate: allergies, current medications, past family history, past medical history, past social history, past surgical history and problem list. Problem list updated.   See flowsheet for other program required questions.  Objective:   Vitals:   06/28/19 1403  BP: 106/66  Weight: 138 lb (62.6 kg)  Height: 5\' 5"  (1.651 m)    Physical Exam Constitutional:      Appearance: Normal appearance.  Abdominal:     Palpations: Abdomen is soft.     Comments: Soft without tenderness  Genitourinary:    General: Normal vulva.     Exam position: Lithotomy position.     Vagina: Vaginal discharge (brown-red sl malodorous leukorrhea, ph>4.5) present.     Cervix: Normal.     Uterus: Normal.      Adnexa: Right adnexa normal and left adnexa normal.     Rectum: Normal.  Neurological:     Mental Status: She is alert.       Assessment and Plan:  Theresa Roberson is a 18 y.o. female presenting to the [facility] Department for a Women's Health problem visit  1. Family planning ROI for last DMPA at [facility] please  2. Screening examination for venereal disease Treat wet mount  per standing orders Immunization nurse consult - WET PREP FOR [place], YEAST, CLUE - Syphilis Serology, [facility] - HIV [place] [facility] - Chlamydia/Gonorrhea [facility]  3. Encounter for surveillance of injectable contraceptive If last DMPA less than 16 wks ago, may have DMPA 150 mg IM x1  4. Herpes Taking Acyclovir daily     Return in about 3 months (around 09/28/2019) for 11-13 wk DMPA.  Future Appointments  Date Time Provider [place]  07/09/2019 11:00 AM Milton Ferguson, LCSW [facility] None    Herbie Saxon, CNM

## 2019-06-29 NOTE — Progress Notes (Signed)
Wet mount reviewed and pt treated for BV per standing order and per Ola Spurr, CNM verbal order. Phone call to [facility] as pt reports is fine with [place] contacting them to find out last Depo date, and per [facility] pt's last Depo was 03/23/2019, so pt is 13 weeks and 6 days post last Depo. Pt received Depo 150mg  IM per provider order and pt tolerated well. Provider orders completed.Ronny Bacon, RN

## 2019-07-09 ENCOUNTER — Ambulatory Visit: Payer: Medicaid Other | Admitting: Licensed Clinical Social Worker

## 2019-07-22 ENCOUNTER — Emergency Department
Admission: EM | Admit: 2019-07-22 | Discharge: 2019-07-23 | Disposition: A | Discharge status: Home / Self Care | Payer: Medicaid Other | Attending: Emergency Medicine | Admitting: Emergency Medicine

## 2019-07-22 ENCOUNTER — Other Ambulatory Visit: Payer: Self-pay

## 2019-07-22 DIAGNOSIS — Z79899 Other long term (current) drug therapy: Secondary | ICD-10-CM | POA: Diagnosis not present

## 2019-07-22 DIAGNOSIS — Z20822 Contact with and (suspected) exposure to covid-19: Secondary | ICD-10-CM

## 2019-07-22 DIAGNOSIS — U071 COVID-19: Secondary | ICD-10-CM | POA: Insufficient documentation

## 2019-07-22 DIAGNOSIS — R519 Headache, unspecified: Secondary | ICD-10-CM | POA: Diagnosis not present

## 2019-07-22 DIAGNOSIS — B349 Viral infection, unspecified: Secondary | ICD-10-CM | POA: Diagnosis not present

## 2019-07-22 DIAGNOSIS — Z1152 Encounter for screening for COVID-19: Secondary | ICD-10-CM

## 2019-07-22 DIAGNOSIS — Z20828 Contact with and (suspected) exposure to other viral communicable diseases: Secondary | ICD-10-CM

## 2019-07-22 MED ORDER — ACETAMINOPHEN 325 MG PO TABS
650.0000 mg | ORAL_TABLET | Freq: Once | ORAL | Status: AC
  Administered 2019-07-22: 650 mg via ORAL
  Filled 2019-07-22: qty 2

## 2019-07-22 MED ORDER — ACETAMINOPHEN 500 MG PO TABS: 500.0000 mg | ORAL_TABLET | Freq: Four times a day (QID) | ORAL | 0 refills | Status: AC | PRN

## 2019-07-22 MED ORDER — KETOROLAC TROMETHAMINE 30 MG/ML IJ SOLN
30.0000 mg | Freq: Once | INTRAMUSCULAR | Status: AC
  Administered 2019-07-22: 30 mg via INTRAMUSCULAR
  Filled 2019-07-22: qty 1

## 2019-07-22 NOTE — ED Provider Notes (Signed)
[facility] Emergency Department Provider Note  ____________________________________________  Time seen: Approximately 11:04 PM  I have reviewed the triage vital signs and the nursing notes.   HISTORY  Chief Complaint Headache    HPI Theresa Roberson is a 18 y.o. female presents to the emergency department for evaluation of headache and fatigue today.  Patient states that she woke up this morning with a headache.  She is kind had a mild headache all day.  She also has some mild rhinorrhea and an occasional cough. She had an episode of diarrhea this morning. She has kind of been tired and weak all day.  She thought maybe she had a fever today but her mother checked and said that she did not.  She took some ibuprofen.  She also drank some tea and chicken noodle soup today.  Patient just found out that her grandmother tested positive for Covid over the holidays.  She has 2 coworkers that are also positive.  Patient went to urgent care today for a Covid test but is concerned that the test will take a long time.  She came to the emergency department today for a faster test.  All of her siblings are getting tested as well.  She attends [employer].  Her vaccinations are up-to-date.  She denies any confusion, dizziness, neck pain, shortness of breath, chest pain, vomiting, abdominal pain.  Past Medical History:  Diagnosis Date  . Genital herpes 12/2018    Patient Active Problem List   Diagnosis Date Noted  . Herpes 06/28/2019  . History of chlamydia 04/17/2019    History reviewed. No pertinent surgical history.  Prior to Admission medications   Medication Sig Start Date End Date Taking? Authorizing Provider  acetaminophen (TYLENOL) 500 MG tablet Take 1 tablet (500 mg total) by mouth every 6 (six) hours as needed. 07/22/19   Laban Emperor, PA-C  acyclovir (ZOVIRAX) 800 MG tablet Take 800 mg by mouth daily. 03/07/19   [provider]   clindamycin (CLEOCIN T) 1 % lotion Apply topically 2 (two) times daily. Patient not taking: Reported on 06/28/2019 04/17/19   Diona Fanti, CNM  clindamycin (CLEOCIN) 300 MG capsule Take 1 capsule (300 mg total) by mouth 2 (two) times daily. For ten days Patient not taking: Reported on 06/28/2019 04/20/19   Diona Fanti, CNM  medroxyPROGESTERone (DEPO-PROVERA) 150 MG/ML injection Inject into the muscle every 3 (three) months. 09/15/18   [provider]    Allergies Patient has no known allergies.  Family History  Problem Relation Age of Onset  . Breast cancer Neg Hx   . Ovarian cancer Neg Hx   . Colon cancer Neg Hx     Social History Social History   Tobacco Use  . Smoking status: Never Smoker  . Smokeless tobacco: Never Used  Substance Use Topics  . Alcohol use: Not Currently    Alcohol/week: 1.0 standard drinks    Types: 1 Glasses of wine per week  . Drug use: Never     Review of Systems  Constitutional: No fever/chills Eyes: No visual changes. No discharge. ENT: Positive for intermittent rhinorrhea. Negative for congestion. Cardiovascular: No chest pain. Respiratory: Positive for intermittent cough. No SOB. Gastrointestinal: No abdominal pain.  No nausea, no vomiting.  No diarrhea.  No constipation. Musculoskeletal: Negative for musculoskeletal pain. Skin: Negative for rash, abrasions, lacerations, ecchymosis. Neurological: Positive for mild headache.   ____________________________________________   PHYSICAL EXAM:  VITAL SIGNS: ED Triage Vitals  Enc Vitals Group     BP 07/22/19 2203 125/72     Pulse Rate 07/22/19 2203 (!) 102     Resp 07/22/19 2203 16     Temp 07/22/19 2203 100 F (37.8 C)     Temp Source 07/22/19 2203 Oral     SpO2 07/22/19 2203 100 %     Weight 07/22/19 2202 140 lb (63.5 kg)     Height 07/22/19 2202 5\' 6"  (1.676 m)     Head Circumference --      Peak Flow --      Pain Score 07/22/19 2208 7     Pain  Loc --      Pain Edu? --      Excl. in GC? --      Constitutional: Alert and oriented. Well appearing and in no acute distress. Eyes: Conjunctivae are normal. PERRL. EOMI. No discharge. Head: Atraumatic. ENT: No frontal and maxillary sinus tenderness.      Ears: Tympanic membranes are pink. No discharge.      Nose: Minimal congestion/rhinnorhea.      Mouth/Throat: Mucous membranes are moist. Oropharynx non-erythematous. Tonsils not enlarged. No exudates. Uvula midline. Neck: No stridor.   Hematological/Lymphatic/Immunilogical: No cervical lymphadenopathy. Cardiovascular: Normal rate, regular rhythm.  Good peripheral circulation. Respiratory: Normal respiratory effort without tachypnea or retractions. Lungs CTAB. Good air entry to the bases with no decreased or absent breath sounds. Gastrointestinal: Bowel sounds 4 quadrants. Soft and nontender to palpation. No guarding or rigidity. No palpable masses. No distention. Musculoskeletal: Full range of motion to all extremities. No gross deformities appreciated. Neurologic:  Normal speech and language. No gross focal neurologic deficits are appreciated.  Skin:  Skin is warm, dry and intact. No rash noted. Psychiatric: Mood and affect are normal. Speech and behavior are normal. Patient exhibits appropriate insight and judgement.   ____________________________________________   LABS (all labs ordered are listed, but only abnormal results are displayed)  Labs Reviewed  SARS CORONAVIRUS 2 (TAT 6-24 HRS)   ____________________________________________  EKG   ____________________________________________  RADIOLOGY  No results found.  ____________________________________________    PROCEDURES  Procedure(s) performed:    Procedures    Medications  ketorolac (TORADOL) 30 MG/ML injection 30 mg (30 mg Intramuscular Given 07/22/19 2305)  acetaminophen (TYLENOL) tablet 650 mg (650 mg Oral Given 07/22/19 2305)      ____________________________________________   INITIAL IMPRESSION / ASSESSMENT AND PLAN / ED COURSE  Pertinent labs & imaging results that were available during my care of the patient were reviewed by me and considered in my medical decision making (see chart for details).  Review of the [place] CSRS was performed in accordance of the [facility] prior to dispensing any controlled drugs.     Patient's diagnosis is consistent with viral illness. Vital signs and exam are reassuring.  Covid test in process.  Patient is drinking water in the emergency room.  Patient appears well and is staying well hydrated.  Patient was given Tylenol and Toradol for her headache.  Headache completely resolved in the emergency department with medications.  Patient feels comfortable going home. Patient will be discharged home with prescriptions for tylenol. Patient is to follow up with primary care as needed or otherwise directed. Patient is given ED precautions to return to the ED for any worsening or new symptoms.  Theresa Roberson was evaluated in Emergency Department on 07/23/2019 for the symptoms described in the history of present illness. She was evaluated in the context of the global  COVID-19 pandemic, which necessitated consideration that the patient might be at risk for infection with the SARS-CoV-2 virus that causes COVID-19. Institutional protocols and algorithms that pertain to the evaluation of patients at risk for COVID-19 are in a state of rapid change based on information released by regulatory bodies including the CDC and federal and state organizations. These policies and algorithms were followed during the patient's care in the ED.   ____________________________________________  FINAL CLINICAL IMPRESSION(S) / ED DIAGNOSES  Final diagnoses:  Viral illness  Encounter for screening laboratory testing for COVID-19 virus      NEW MEDICATIONS STARTED DURING THIS VISIT:  ED Discharge Orders          Ordered    acetaminophen (TYLENOL) 500 MG tablet  Every 6 hours PRN     07/22/19 2356              This chart was dictated using voice recognition software/Dragon. Despite best efforts to proofread, errors can occur which can change the meaning. Any change was purely unintentional.    Enid DerryWagner, Mystie Ormand, PA-C 07/23/19 0011    Chesley NoonJessup, Charles, MD 07/23/19 2015

## 2019-07-22 NOTE — ED Triage Notes (Signed)
Patient to ED for headache and not feeling well. States her grandmother tested positive for Covid and was around them for THanksgiving. Patient complains of headache and not feeling well despite drinking tea, and chicken noodle soup. Patient without respiratory distress. Able to speak in complete sentences without difficulty.

## 2019-07-22 NOTE — ED Notes (Signed)
Pt drinking water at this time.

## 2019-07-23 ENCOUNTER — Telehealth: Payer: Self-pay | Admitting: Emergency Medicine

## 2019-07-23 LAB — SARS CORONAVIRUS 2 (TAT 6-24 HRS): SARS Coronavirus 2: POSITIVE — AB

## 2019-07-23 NOTE — Telephone Encounter (Signed)
caled patient to inform of positive covid.  Explained isolation and quarantine guidelines.

## 2019-08-08 DIAGNOSIS — Z1159 Encounter for screening for other viral diseases: Secondary | ICD-10-CM | POA: Diagnosis not present

## 2019-08-08 DIAGNOSIS — Z03818 Encounter for observation for suspected exposure to other biological agents ruled out: Secondary | ICD-10-CM | POA: Diagnosis not present

## 2019-09-13 ENCOUNTER — Ambulatory Visit: Payer: Medicaid Other

## 2019-09-13 ENCOUNTER — Other Ambulatory Visit: Payer: Self-pay

## 2019-09-13 ENCOUNTER — Other Ambulatory Visit: Payer: Self-pay | Admitting: Physician Assistant

## 2019-09-13 DIAGNOSIS — Z3042 Encounter for surveillance of injectable contraceptive: Secondary | ICD-10-CM

## 2019-09-13 MED ORDER — MEDROXYPROGESTERONE ACETATE 150 MG/ML IM SUSP: 150.0000 mg | INTRAMUSCULAR | Status: AC

## 2019-09-13 NOTE — Progress Notes (Signed)
Per chart, last RP 06/2018.  CBE due 2021 and pap due 2022.  Provided that patient desires to continue with Depo and BP is normal, may continue with Depo.

## 2019-09-14 ENCOUNTER — Ambulatory Visit (INDEPENDENT_AMBULATORY_CARE_PROVIDER_SITE_OTHER): Payer: Medicaid Other | Admitting: Obstetrics and Gynecology

## 2019-09-14 VITALS — BP 93/55 | HR 79 | Ht 66.0 in | Wt 144.5 lb

## 2019-09-14 DIAGNOSIS — Z3042 Encounter for surveillance of injectable contraceptive: Secondary | ICD-10-CM

## 2019-09-14 MED ORDER — MEDROXYPROGESTERONE ACETATE 150 MG/ML IM SUSP
150.0000 mg | Freq: Once | INTRAMUSCULAR | Status: AC
  Administered 2019-09-14: 09:00:00 150 mg via INTRAMUSCULAR

## 2019-09-14 NOTE — Progress Notes (Signed)
Date last pap: underage Last Depo-Provera: 03/23/19 Side Effects if any: none. Serum HCG indicated? n/a. Depo-Provera 150 mg IM given by: FHampton, LPN. Next appointment due April 23-Dec 14, 2019.   BP (!) 93/55   Pulse 79   Ht 5\' 6"  (1.676 m)   Wt 144 lb 8 oz (65.5 kg)   LMP 08/29/2019   BMI 23.32 kg/m

## 2019-09-20 ENCOUNTER — Telehealth: Payer: Self-pay

## 2019-09-20 NOTE — Telephone Encounter (Signed)
Patient can be seen at 9:00, 10:30, 11:15, or 3:00 tomorrow.

## 2019-09-20 NOTE — Telephone Encounter (Signed)
Patient is having a herpes breakout & symptoms of an additional STD. Odor and lots of discharge. No available slots to put her on schel for tomo or next week. Please call patient

## 2019-09-21 NOTE — Telephone Encounter (Signed)
JW called pt and scheduled her to see JML on Monday.

## 2019-09-24 ENCOUNTER — Ambulatory Visit (INDEPENDENT_AMBULATORY_CARE_PROVIDER_SITE_OTHER): Payer: Medicaid Other | Admitting: Certified Nurse Midwife

## 2019-09-24 ENCOUNTER — Encounter: Payer: Self-pay | Admitting: Certified Nurse Midwife

## 2019-09-24 ENCOUNTER — Other Ambulatory Visit: Payer: Self-pay

## 2019-09-24 ENCOUNTER — Other Ambulatory Visit (HOSPITAL_COMMUNITY)
Admission: RE | Admit: 2019-09-24 | Discharge: 2019-09-24 | Disposition: A | Discharge status: Home / Self Care | Payer: Medicaid Other | Source: Ambulatory Visit | Attending: Certified Nurse Midwife | Admitting: Certified Nurse Midwife

## 2019-09-24 VITALS — BP 93/54 | HR 68 | Ht 66.0 in | Wt 142.2 lb

## 2019-09-24 DIAGNOSIS — Z202 Contact with and (suspected) exposure to infections with a predominantly sexual mode of transmission: Secondary | ICD-10-CM | POA: Insufficient documentation

## 2019-09-24 DIAGNOSIS — B009 Herpesviral infection, unspecified: Secondary | ICD-10-CM | POA: Diagnosis not present

## 2019-09-24 MED ORDER — AZITHROMYCIN 500 MG PO TABS: 1000.0000 mg | ORAL_TABLET | Freq: Once | ORAL | 0 refills | Status: AC

## 2019-09-24 NOTE — Progress Notes (Signed)
Patient here for STD testing "the person I was sleeping with tested positive for Chlamydia".  Patient had what she believes was her second HSV outbreak and was very irritated vaginally but feels better today.

## 2019-09-24 NOTE — Progress Notes (Signed)
GYN ENCOUNTER NOTE  Subjective:       Theresa Roberson is a 19 y.o. G0P0000 female is here for gynecologic evaluation of the following issues:  1. Requests STI testing after positive result from significant other last week 2. Notes recent herpes outbreak-taking medication daily; no active lesions now  Denies difficulty breathing or respiratory distress, chest pain, abdominal pain, excessive vaginal bleeding, dysuria, and leg pain or swelling.    Gynecologic History  Patient's last menstrual period was 08/29/2019 (approximate).   Contraception: Depo-Provera injections   Last Pap: N/A.   Obstetric History OB History  Gravida Para Term Preterm AB Living  0 0 0 0 0 0  SAB TAB Ectopic Multiple Live Births  0 0 0 0 0    Past Medical History:  Diagnosis Date  . Genital herpes 12/2018    No past surgical history on file.  Current Outpatient Medications on File Prior to Visit  Medication Sig Dispense Refill  . acetaminophen (TYLENOL) 500 MG tablet Take 1 tablet (500 mg total) by mouth every 6 (six) hours as needed. 30 tablet 0  . acyclovir (ZOVIRAX) 800 MG tablet Take 800 mg by mouth daily.    . medroxyPROGESTERone (DEPO-PROVERA) 150 MG/ML injection Inject into the muscle every 3 (three) months.     Current Facility-Administered Medications on File Prior to Visit  Medication Dose Route Frequency Provider Last Rate Last Admin  . medroxyPROGESTERone (DEPO-PROVERA) injection 150 mg  150 mg Intramuscular Q90 days Rolley Sims, Carla J, [place]        No Known Allergies  Social History   Socioeconomic History  . Marital status: Single    Spouse name: Not on file  . Number of children: 0  . Years of education: 63  . Highest education level: High school graduate  Occupational History  . Not on file  Tobacco Use  . Smoking status: Never Smoker  . Smokeless tobacco: Never Used  Substance and Sexual Activity  . Alcohol use: Not Currently    Alcohol/week: 1.0 standard drinks    Types:  1 Glasses of wine per week  . Drug use: Never  . Sexual activity: Not Currently    Birth control/protection: Injection  Other Topics Concern  . Not on file  Social History Narrative   Patient lives with her mom and her two younger brothers. She has a very close relationship with her mom and god father. Her dad is in her life but she doesn't have the close relationship she desires to have with him. Patient has a lot of supportive friendships. She has one best friend and was best friends with one of her sisters, but they are no longer talking at this time.    Social Determinants of Health   Financial Resource Strain:   . Difficulty of Paying Living Expenses: Not on file  Food Insecurity:   . Worried About Programme researcher, broadcasting/film/video in the Last Year: Not on file  . Ran Out of Food in the Last Year: Not on file  Transportation Needs:   . Lack of Transportation (Medical): Not on file  . Lack of Transportation (Non-Medical): Not on file  Physical Activity:   . Days of Exercise per Week: Not on file  . Minutes of Exercise per Session: Not on file  Stress:   . Feeling of Stress : Not on file  Social Connections: Unknown  . Frequency of Communication with Friends and Family: More than three times a week  . Frequency of  Social Gatherings with Friends and Family: More than three times a week  . Attends Religious Services: Not on file  . Active Member of Clubs or Organizations: No  . Attends Archivist Meetings: Never  . Marital Status: Never married  Intimate Partner Violence:   . Fear of Current or Ex-Partner: Not on file  . Emotionally Abused: Not on file  . Physically Abused: Not on file  . Sexually Abused: Not on file    Family History  Problem Relation Age of Onset  . Breast cancer Neg Hx   . Ovarian cancer Neg Hx   . Colon cancer Neg Hx     The following portions of the patient's history were reviewed and updated as appropriate: allergies, current medications, past family  history, past medical history, past social history, past surgical history and problem list.  Review of Systems  ROS negative except as noted above. Information obtained from patient.   Objective:   BP (!) 93/54   Pulse 68   Ht 5\' 6"  (1.676 m)   Wt 142 lb 3.2 oz (64.5 kg)   LMP 08/29/2019 (Approximate)   BMI 22.95 kg/m    CONSTITUTIONAL: Well-developed, well-nourished female in no acute distress.   PELVIC:  External Genitalia: Normal except for furuncle to left groin  Vagina: Vaginal swab collected   MUSCULOSKELETAL: Normal range of motion. No tenderness.  No cyanosis, clubbing, or edema.   Assessment:   1. Possible exposure to STD  - Cervicovaginal ancillary only  2. Herpes   Plan:   Vaginal swab collected, will contact patient with results  Rx Azithroymcin, see orders  Discussed outbreak management including use of vitamin C  Encouraged to pick up Cleocin T Rx  Reviewed red flag symptoms and when to call  RTC x 3 months for ANNUAL EXAM or sooner if needed.   Diona Fanti, CNM [facility], [facility] 09/24/19 10:09 AM

## 2019-09-24 NOTE — Patient Instructions (Addendum)
Azithromycin tablets What is this medicine? AZITHROMYCIN (az ith roe MYE sin) is a macrolide antibiotic. It is used to treat or prevent certain kinds of bacterial infections. It will not work for colds, flu, or other viral infections. This medicine may be used for other purposes; ask your health care provider or pharmacist if you have questions. COMMON BRAND NAME(S): Zithromax, Zithromax Tri-Pak, Zithromax Z-Pak What should I tell my health care provider before I take this medicine? They need to know if you have any of these conditions:  history of blood diseases, like leukemia  history of irregular heartbeat  kidney disease  liver disease  myasthenia gravis  an unusual or allergic reaction to azithromycin, erythromycin, other macrolide antibiotics, foods, dyes, or preservatives  pregnant or trying to get pregnant  breast-feeding How should I use this medicine? Take this medicine by mouth with a full glass of water. Follow the directions on the prescription label. The tablets can be taken with food or on an empty stomach. If the medicine upsets your stomach, take it with food. Take your medicine at regular intervals. Do not take your medicine more often than directed. Take all of your medicine as directed even if you think your are better. Do not skip doses or stop your medicine early. Talk to your pediatrician regarding the use of this medicine in children. While this drug may be prescribed for children as young as 6 months for selected conditions, precautions do apply. Overdosage: If you think you have taken too much of this medicine contact a poison control center or emergency room at once. NOTE: This medicine is only for you. Do not share this medicine with others. What if I miss a dose? If you miss a dose, take it as soon as you can. If it is almost time for your next dose, take only that dose. Do not take double or extra doses. What may interact with this medicine? Do not take  this medicine with any of the following medications:  cisapride  dronedarone  pimozide  thioridazine This medicine may also interact with the following medications:  antacids that contain aluminum or magnesium  birth control pills  colchicine  cyclosporine  digoxin  ergot alkaloids like dihydroergotamine, ergotamine  nelfinavir  other medicines that prolong the QT interval (an abnormal heart rhythm)  phenytoin  warfarin This list may not describe all possible interactions. Give your health care provider a list of all the medicines, herbs, non-prescription drugs, or dietary supplements you use. Also tell them if you smoke, drink alcohol, or use illegal drugs. Some items may interact with your medicine. What should I watch for while using this medicine? Tell your doctor or healthcare provider if your symptoms do not start to get better or if they get worse. This medicine may cause serious skin reactions. They can happen weeks to months after starting the medicine. Contact your healthcare provider right away if you notice fevers or flu-like symptoms with a rash. The rash may be red or purple and then turn into blisters or peeling of the skin. Or, you might notice a red rash with swelling of the face, lips or lymph nodes in your neck or under your arms. Do not treat diarrhea with over the counter products. Contact your doctor if you have diarrhea that lasts more than 2 days or if it is severe and watery. This medicine can make you more sensitive to the sun. Keep out of the sun. If you cannot avoid being in the   sun, wear protective clothing and use sunscreen. Do not use sun lamps or tanning beds/booths. What side effects may I notice from receiving this medicine? Side effects that you should report to your doctor or health care professional as soon as possible:  allergic reactions like skin rash, itching or hives, swelling of the face, lips, or tongue  bloody or watery  diarrhea  breathing problems  chest pain  fast, irregular heartbeat  muscle weakness  rash, fever, and swollen lymph nodes  redness, blistering, peeling, or loosening of the skin, including inside the mouth  signs and symptoms of liver injury like dark yellow or brown urine; general ill feeling or flu-like symptoms; light-colored stools; loss of appetite; nausea; right upper belly pain; unusually weak or tired; yellowing of the eyes or skin  white patches or sores in the mouth  unusually weak or tired Side effects that usually do not require medical attention (report to your doctor or health care professional if they continue or are bothersome):  diarrhea  nausea  stomach pain  vomiting This list may not describe all possible side effects. Call your doctor for medical advice about side effects. You may report side effects to FDA at 1-800-FDA-1088. Where should I keep my medicine? Keep out of the reach of children. Store at room temperature between 15 and 30 degrees C (59 and 86 degrees F). Throw away any unused medicine after the expiration date. NOTE: This sheet is a summary. It may not cover all possible information. If you have questions about this medicine, talk to your doctor, pharmacist, or health care provider.  2020 Elsevier/Gold Standard (2018-11-02 17:19:20) Genital Herpes Genital herpes is a common sexually transmitted infection (STI) that is caused by a virus. The virus spreads from person to person through sexual contact. Infection can cause itching, blisters, and sores around the genitals or rectum. Symptoms may last several days and then go away This is called an outbreak. However, the virus remains in your body, so you may have more outbreaks in the future. The time between outbreaks varies and can be months or years. Genital herpes affects men and women. It is particularly concerning for pregnant women because the virus can be passed to the baby during delivery  and can cause serious problems. Genital herpes is also a concern for people who have a weak disease-fighting (immune) system. What are the causes? This condition is caused by the herpes simplex virus (HSV) type 1 or type 2. The virus may spread through:  Sexual contact with an infected person, including vaginal, anal, and oral sex.  Contact with fluid from a herpes sore.  The skin. This means that you can get herpes from an infected partner even if he or she does not have a visible sore or does not know that he or she is infected. What increases the risk? You are more likely to develop this condition if:  You have sex with many partners.  You do not use latex condoms during sex. What are the signs or symptoms? Most people do not have symptoms (asymptomatic) or have mild symptoms that may be mistaken for other skin problems. Symptoms may include:  Small red bumps near the genitals, rectum, or mouth. These bumps turn into blisters and then turn into sores.  Flu-like symptoms, including: ? Fever. ? Body aches. ? Swollen lymph nodes. ? Headache.  Painful urination.  Pain and itching in the genital area or rectal area.  Vaginal discharge.  Tingling or shooting pain in  the legs and buttocks. Generally, symptoms are more severe and last longer during the first (primary) outbreak. Flu-like symptoms are also more common during the primary outbreak. How is this diagnosed? Genital herpes may be diagnosed based on:  A physical exam.  Your medical history.  Blood tests.  A test of a fluid sample (culture) from an open sore. How is this treated? There is no cure for this condition, but treatment with antiviral medicines that are taken by mouth (orally) can do the following:  Speed up healing and relieve symptoms.  Help to reduce the spread of the virus to sexual partners.  Limit the chance of future outbreaks, or make future outbreaks shorter.  Lessen symptoms of future  outbreaks. Your health care provider may also recommend pain relief medicines, such as aspirin or ibuprofen. Follow these instructions at home: Sexual activity  Do not have sexual contact during active outbreaks.  Practice safe sex. Latex condoms and female condoms may help prevent the spread of the herpes virus. General instructions  Keep the affected areas dry and clean.  Take over-the-counter and prescription medicines only as told by your health care provider.  Avoid rubbing or touching blisters and sores. If you do touch blisters or sores: ? Wash your hands thoroughly with soap and water. ? Do not touch your eyes afterward.  To help relieve pain or itching, you may take the following actions as directed by your health care provider: ? Apply a cold, wet cloth (cold compress) to affected areas 4-6 times a day. ? Apply a substance that protects your skin and reduces bleeding (astringent). ? Apply a gel that helps relieve pain around sores (lidocaine gel). ? Take a warm, shallow bath that cleans the genital area (sitz bath).  Keep all follow-up visits as told by your health care provider. This is important. How is this prevented?  Use condoms. Although anyone can get genital herpes during sexual contact, even with the use of a condom, a condom can provide some protection.  Avoid having multiple sexual partners.  Talk with your sexual partner about any symptoms either of you may have. Also, talk with your partner about any history of STIs.  Get tested for STIs before you have sex. Ask your partner to do the same.  Do not have sexual contact if you have symptoms of genital herpes. Contact a health care provider if:  Your symptoms are not improving with medicine.  Your symptoms return.  You have new symptoms.  You have a fever.  You have abdominal pain.  You have redness, swelling, or pain in your eye.  You notice new sores on other parts of your body.  You are a  woman and experience bleeding between menstrual periods.  You have had herpes and you become pregnant or plan to become pregnant. Summary  Genital herpes is a common sexually transmitted infection (STI) that is caused by the herpes simplex virus (HSV) type 1 or type 2.  These viruses are most often spread through sexual contact with an infected person.  You are more likely to develop this condition if you have sex with many partners or you have unprotected sex.  Most people do not have symptoms (asymptomatic) or have mild symptoms that may be mistaken for other skin problems. Symptoms occur as outbreaks that may happen months or years apart.  There is no cure for this condition, but treatment with oral antiviral medicines can reduce symptoms, reduce the chance of spreading the virus to  a partner, prevent future outbreaks, or shorten future outbreaks. This information is not intended to replace advice given to you by your health care provider. Make sure you discuss any questions you have with your health care provider. Document Revised: 01/30/2018 Document Reviewed: 06/25/2016 Elsevier Patient Education  [place]. Chlamydia, Female  Chlamydia is a STD (sexually transmitted disease). This is an infection that spreads through sexual contact. If it is not treated, it can cause serious problems. It must be treated with antibiotic medicine. If this infection is not treated and you are pregnant or become pregnant, your baby could get it during delivery. This may cause bad health problems for the baby. Sometimes, you may not have symptoms (asymptomatic). When you have symptoms, they can include:  Burning when you pee (urinate).  Peeing often.  Fluid (discharge) coming from the vagina.  Redness, soreness, and swelling (inflammation) of the butt (rectum).  Bleeding or fluid coming from the butt.  Belly (abdominal) pain.  Pain during sex.  Bleeding between periods.  Itching,  burning, or redness in the eyes.  Fluid coming from the eyes. Follow these instructions at home: Medicines  Take over-the-counter and prescription medicines only as told by your doctor.  Take your antibiotic medicine as told by your doctor. Do not stop taking the antibiotic even if you start to feel better. Sexual activity  Tell sex partners about your infection. Sex partners are people you had oral, anal, or vaginal sex with within 60 days of when you started getting sick. They need treatment, too.  Do not have sex until: ? You and your sex partners have been treated. ? Your doctor says it is okay.  If you have a single dose treatment, wait 7 days before having sex. General instructions  It is up to you to get your test results. Ask your doctor when your results will be ready.  Get a lot of rest.  Eat healthy foods.  Drink enough fluid to keep your pee (urine) clear or pale yellow.  Keep all follow-up visits as told by your doctor. You may need tests after 3 months. Preventing chlamydia  The only way to prevent chlamydia is not to have sex. To lower your risk: ? Use latex condoms correctly. Do this every time you have sex. ? Avoid having many sex partners. ? Ask if your partner has been tested for STDs and if he or she had negative results. Contact a doctor if:  You get new symptoms.  You do not get better with treatment.  You have a fever or chills.  You have pain during sex. Get help right away if:  Your pain gets worse and does not get better with medicine.  You get flu-like symptoms, such as: ? Night sweats. ? Sore throat. ? Muscle aches.  You feel sick to your stomach (nauseous).  You throw up (vomit).  You have trouble swallowing.  You have bleeding: ? Between periods. ? After sex.  You have irregular periods.  You have belly pain that does not get better with medicine.  You have lower back pain that does not get better with medicine.  You  feel weak or dizzy.  You pass out (faint).  You are pregnant and you get symptoms of chlamydia. Summary  Chlamydia is an infection that spreads through sexual contact.  Sometimes, chlamydia can cause no symptoms (asymptomatic).  Do not have sex until your doctor says it is okay.  All sex partners will have to be  treated for chlamydia. This information is not intended to replace advice given to you by your health care provider. Make sure you discuss any questions you have with your health care provider. Document Revised: 01/17/2018 Document Reviewed: 07/15/2016 Elsevier Patient Education  2020 Elsevier Inc. Ascorbic Acid, Vitamin C capsules and tablets, extended release What is this medicine? ASCORBIC ACID (a SKOR bik AS id) is a naturally occurring form of vitamin C. It is used to treat or prevent low levels of vitamin C and to treat scurvy. This medicine may be used for other purposes; ask your health care provider or pharmacist if you have questions. What should I tell my health care provider before I take this medicine? They need to know if you have any of the following conditions: anemia diabetes glucose-6-phosphate dehydrogenase (G6PD) deficiency kidney stones low sodium diet an unusual or allergic reaction to ascorbic acid, tartrazine, other medicines, foods, dyes, or preservatives pregnant or trying to get pregnant breast-feeding How should I use this medicine? Take this medicine by mouth with a glass of water. Follow the directions on the package or prescription label. Do not cut, crush or chew this medicine. You may take this medicine with or without food. If it upsets your stomach take it with food. Take your medicine at regular intervals. Do not take your medicine more often than directed. Talk to your pediatrician regarding the use of this medicine in children. While this drug may be prescribed for selected conditions, precautions do apply. Overdosage: If you think you  have taken too much of this medicine contact a poison control center or emergency room at once. NOTE: This medicine is only for you. Do not share this medicine with others. What if I miss a dose? If you miss a dose, take it as soon as you can. If it is almost time for your next dose, take only that dose. Do not take double or extra doses. What may interact with this medicine? deferoxamine iron supplements This list may not describe all possible interactions. Give your health care provider a list of all the medicines, herbs, non-prescription drugs, or dietary supplements you use. Also tell them if you smoke, drink alcohol, or use illegal drugs. Some items may interact with your medicine. What should I watch for while using this medicine? Follow a good diet. Taking a vitamin supplement does not replace the need for a balanced diet. Some foods that have vitamin C naturally are citrus fruits, green peppers, broccoli, cabbage, and tomatoes. If you are diabetic very high doses of ascorbic acid can interfere with tests for sugar in the urine. Talk to your doctor or heath care professional if you check your urine glucose levels. What side effects may I notice from receiving this medicine? Side effects that you should report to your doctor or health care professional as soon as possible: allergic reactions like skin rash, itching or hives, swelling of the face, lips, or tongue breathing problems diarrhea with headache or nausea flushing or redness of skin pain in lower back, side, or stomach pain on swallowing Side effects that usually do not require medical attention (report to your doctor or health care professional if they continue or are bothersome): bad taste in the mouth stomach upset This list may not describe all possible side effects. Call your doctor for medical advice about side effects. You may report side effects to FDA at 1-800-FDA-1088. Where should I keep my medicine? Keep out of the  reach of children. Store at room  temperature between 15 and 30 degrees C (59 and 86 degrees F) or as directed on the package label. Protect from heat and moisture. Throw away any unused medicine after the expiration date. NOTE: This sheet is a summary. It may not cover all possible information. If you have questions about this medicine, talk to your doctor, pharmacist, or health care provider.  2020 Elsevier/Gold Standard (2016-11-22 12:01:12)

## 2019-09-25 LAB — CERVICOVAGINAL ANCILLARY ONLY
Bacterial Vaginitis (gardnerella): POSITIVE — AB
Candida Glabrata: NEGATIVE
Candida Vaginitis: NEGATIVE
Chlamydia: POSITIVE — AB
Comment: NEGATIVE
Comment: NEGATIVE
Comment: NEGATIVE
Comment: NEGATIVE
Comment: NEGATIVE
Comment: NORMAL
Neisseria Gonorrhea: NEGATIVE
Trichomonas: NEGATIVE

## 2019-09-25 NOTE — Telephone Encounter (Signed)
error 

## 2019-09-26 ENCOUNTER — Other Ambulatory Visit: Payer: Self-pay

## 2019-09-26 MED ORDER — METRONIDAZOLE 0.75 % VA GEL: 1.0000 | Freq: Every day | VAGINAL | 0 refills | Status: AC

## 2019-09-26 NOTE — Progress Notes (Signed)
Please contact pt. Already prescribed treatment for chl. May have Flagyl, Metrogel, or Solosec for BV. Needs test of cure in one (1) month. JML

## 2019-10-08 ENCOUNTER — Other Ambulatory Visit: Payer: Self-pay

## 2019-10-08 ENCOUNTER — Telehealth: Payer: Self-pay | Admitting: Certified Nurse Midwife

## 2019-10-08 MED ORDER — CLINDAMYCIN PHOSPHATE 1 % EX LOTN: TOPICAL_LOTION | Freq: Two times a day (BID) | CUTANEOUS | 0 refills | Status: AC

## 2019-10-08 NOTE — Telephone Encounter (Signed)
Patient called saying she needed the name of the vaginal cream Marcelino Duster had prescribed to her. I didn't see any topical creams in her medication list. She didn't say whether she needed it filled just that she needed the name of it.

## 2019-10-08 NOTE — Telephone Encounter (Signed)
Called and spoke with patient.  Patient request prescription be sent in for cream that Serafina Royals, CNM recommended.  Talked with Marcelino Duster, she recommended using Cleocin T to affected area.  Prescriptions sent and patient advised to use [facility] discount card and patient verbalized understanding.

## 2019-10-29 ENCOUNTER — Encounter: Payer: Medicaid Other | Admitting: Certified Nurse Midwife

## 2019-11-05 ENCOUNTER — Telehealth: Payer: Self-pay | Admitting: Certified Nurse Midwife

## 2019-11-05 NOTE — Telephone Encounter (Signed)
Pt called in and stated that she has some questions about her medication ( acyclovir). The pt stated she is in pain in her vagina & the pt has some discharge. Pt is requesting a call back. Please advise

## 2019-11-05 NOTE — Telephone Encounter (Signed)
Please contact patient, missed visit for TOC. Has questions. Needs to be rescheduled. JML

## 2019-11-06 NOTE — Telephone Encounter (Signed)
Tried to call patient voicemail not set up

## 2019-11-06 NOTE — Telephone Encounter (Signed)
Pt called in and left vm to call her. I returned her call and she doesn't have a vm set up. The pt needs to come in for a visit that she no showed.

## 2019-11-07 DIAGNOSIS — N760 Acute vaginitis: Secondary | ICD-10-CM | POA: Diagnosis not present

## 2019-11-07 DIAGNOSIS — Z202 Contact with and (suspected) exposure to infections with a predominantly sexual mode of transmission: Secondary | ICD-10-CM | POA: Diagnosis not present

## 2019-11-12 ENCOUNTER — Telehealth: Payer: Self-pay | Admitting: Certified Nurse Midwife

## 2019-11-12 NOTE — Telephone Encounter (Signed)
No voicemail set up 

## 2019-11-12 NOTE — Telephone Encounter (Signed)
Pt called in and stated that she has some questions about a medication she is on called acyclovir. The pt is requesting a call back. Please advise

## 2019-11-12 NOTE — Telephone Encounter (Signed)
[id number] Telephone call to patient, verified full name and date of birth.   Taking acyclovir for genital herpes. Going away for nine (9) weeks and is unable to take medication daily. Requests "shot".   Advised current treatment for HSV is daily or as needed oral medication.   Encourage patient to follow up as scheduled.    Gunnar Bulla, CNM [facility], [facility] 11/12/19 2:40 PM

## 2019-12-03 ENCOUNTER — Encounter: Payer: Self-pay | Admitting: Certified Nurse Midwife

## 2019-12-03 ENCOUNTER — Other Ambulatory Visit: Payer: Self-pay

## 2019-12-03 ENCOUNTER — Encounter: Payer: Medicaid Other | Admitting: Certified Nurse Midwife

## 2019-12-03 ENCOUNTER — Ambulatory Visit (INDEPENDENT_AMBULATORY_CARE_PROVIDER_SITE_OTHER): Payer: Medicaid Other | Admitting: Certified Nurse Midwife

## 2019-12-03 ENCOUNTER — Other Ambulatory Visit (HOSPITAL_COMMUNITY)
Admission: RE | Admit: 2019-12-03 | Discharge: 2019-12-03 | Disposition: A | Discharge status: Home / Self Care | Payer: Medicaid Other | Source: Ambulatory Visit | Attending: Certified Nurse Midwife | Admitting: Certified Nurse Midwife

## 2019-12-03 VITALS — BP 103/68 | HR 60 | Ht 66.0 in | Wt 142.0 lb

## 2019-12-03 DIAGNOSIS — N898 Other specified noninflammatory disorders of vagina: Secondary | ICD-10-CM | POA: Diagnosis not present

## 2019-12-03 DIAGNOSIS — Z8619 Personal history of other infectious and parasitic diseases: Secondary | ICD-10-CM

## 2019-12-03 NOTE — Patient Instructions (Signed)

## 2019-12-03 NOTE — Progress Notes (Signed)
GYN ENCOUNTER NOTE  Subjective:       Theresa Roberson is a 19 y.o. G0P0000 female is here for gynecologic evaluation of the following issues:  1. Vaginal Discharge 2. Vaginal Odor 3. STI Screening   She reports two (2) to three (3) days of brown vaginal discharge with fishy odor. She started her period one (1) day ago and has not noticed symotoms since.   She was recently diagnosed and treated for Chlamydia at [facility].   She would like STI testing today for G/C, Trich, and wet prep but declines HIV and RPR.   Denies chest pain, respiratory distress, abdominal pain, dysuria, excessive vaginal bleeding, back pain, or pelvic pain.  Gynecologic History  Patient's last menstrual period was 12/02/2019.     Contraception: Depo Provera  Obstetric History  OB History  Gravida Para Term Preterm AB Living  0 0 0 0 0 0  SAB TAB Ectopic Multiple Live Births  0 0 0 0 0    Past Medical History:  Diagnosis Date  . Genital herpes 12/2018  . HSV (herpes simplex virus) infection     History reviewed. No pertinent surgical history.  Current Outpatient Medications on File Prior to Visit  Medication Sig Dispense Refill  . acyclovir (ZOVIRAX) 800 MG tablet Take 800 mg by mouth daily.    . clindamycin (CLEOCIN-T) 1 % lotion Apply topically 2 (two) times daily. 60 mL 0  . medroxyPROGESTERone (DEPO-PROVERA) 150 MG/ML injection Inject into the muscle every 3 (three) months.    Marland Kitchen acetaminophen (TYLENOL) 500 MG tablet Take 1 tablet (500 mg total) by mouth every 6 (six) hours as needed. (Patient not taking: Reported on 12/03/2019) 30 tablet 0   Current Facility-Administered Medications on File Prior to Visit  Medication Dose Route Frequency Provider Last Rate Last Admin  . medroxyPROGESTERone (DEPO-PROVERA) injection 150 mg  150 mg Intramuscular Q90 days [place], Carla J, [place]        No Known Allergies  Social History   Socioeconomic History  . Marital status: Single    Spouse  name: Not on file  . Number of children: 0  . Years of education: 97  . Highest education level: High school graduate  Occupational History  . Not on file  Tobacco Use  . Smoking status: Never Smoker  . Smokeless tobacco: Never Used  Substance and Sexual Activity  . Alcohol use: Not Currently    Alcohol/week: 1.0 standard drinks    Types: 1 Glasses of wine per week  . Drug use: Never  . Sexual activity: Yes    Birth control/protection: Injection  Other Topics Concern  . Not on file  Social History Narrative   Patient lives with her mom and her two younger brothers. She has a very close relationship with her mom and god father. Her dad is in her life but she doesn't have the close relationship she desires to have with him. Patient has a lot of supportive friendships. She has one best friend and was best friends with one of her sisters, but they are no longer talking at this time.    Social Determinants of Health   Financial Resource Strain:   . Difficulty of Paying Living Expenses:   Food Insecurity:   . Worried About Programme researcher, broadcasting/film/video in the Last Year:   . Barista in the Last Year:   Transportation Needs:   . Freight forwarder (Medical):   Marland Kitchen Lack of  Transportation (Non-Medical):   Physical Activity:   . Days of Exercise per Week:   . Minutes of Exercise per Session:   Stress:   . Feeling of Stress :   Social Connections: Unknown  . Frequency of Communication with Friends and Family: More than three times a week  . Frequency of Social Gatherings with Friends and Family: More than three times a week  . Attends Religious Services: Not on file  . Active Member of Clubs or Organizations: No  . Attends Archivist Meetings: Never  . Marital Status: Never married  Intimate Partner Violence:   . Fear of Current or Ex-Partner:   . Emotionally Abused:   Marland Kitchen Physically Abused:   . Sexually Abused:     Family History  Problem Relation Age of Onset  .  Breast cancer Neg Hx   . Ovarian cancer Neg Hx   . Colon cancer Neg Hx     The following portions of the patient's history were reviewed and updated as appropriate: allergies, current medications, past family history, past medical history, past social history, past surgical history and problem list.  Review of Systems  ROS negative except as discussed above. Information obtained from patient.  Objective:   BP 103/68   Pulse 60   Ht 5\' 6"  (1.676 m)   Wt 142 lb (64.4 kg)   LMP 12/02/2019   BMI 22.92 kg/m    CONSTITUTIONAL: Well-developed, well-nourished female in no acute distress.   PELVIC:  External Genitalia: Normal  Vagina: Normal, Vaginal swab collected  Cervix: Normal  MUSCULOSKELETAL: Normal range of motion.  Assessment:   1. Vaginal discharge  - Cervicovaginal ancillary only   2. History of chlamydia  Plan:   1. Aptima Swab sent today for G/C, trichomoniasis, and wet prep  2. Discussed treatment options if positive for bacterial vaginosis. Patient does not want the Flagyl pills due to metallic taste.  3. RTC as previously scheduled or sooner if needed.   Jeff Davis 12/03/19 4:05 PM

## 2019-12-03 NOTE — Progress Notes (Signed)
Patient comes in today for vaginal burning and discharge. She has been having symptoms for two days.

## 2019-12-04 ENCOUNTER — Ambulatory Visit: Payer: Medicaid Other

## 2019-12-04 LAB — CERVICOVAGINAL ANCILLARY ONLY
Bacterial Vaginitis (gardnerella): POSITIVE — AB
Candida Glabrata: NEGATIVE
Candida Vaginitis: POSITIVE — AB
Chlamydia: NEGATIVE
Comment: NEGATIVE
Comment: NEGATIVE
Comment: NEGATIVE
Comment: NEGATIVE
Comment: NEGATIVE
Comment: NORMAL
Neisseria Gonorrhea: NEGATIVE
Trichomonas: NEGATIVE

## 2019-12-05 NOTE — Progress Notes (Signed)
If you are working the nurse schedule tomorrow, could you please inform the patient of results and ask her desired treatment. Thanks, JML

## 2019-12-05 NOTE — Progress Notes (Unsigned)
Pt was a no show

## 2019-12-06 ENCOUNTER — Ambulatory Visit: Payer: Medicaid Other

## 2019-12-07 ENCOUNTER — Telehealth: Payer: Self-pay | Admitting: Certified Nurse Midwife

## 2019-12-07 ENCOUNTER — Ambulatory Visit: Payer: Medicaid Other

## 2019-12-07 DIAGNOSIS — Z3042 Encounter for surveillance of injectable contraceptive: Secondary | ICD-10-CM

## 2019-12-07 MED ORDER — MEDROXYPROGESTERONE ACETATE 150 MG/ML IM SUSP: 150.0000 mg | INTRAMUSCULAR | 3 refills | Status: DC

## 2019-12-07 NOTE — Telephone Encounter (Signed)
Pt called in and stated that she needs a refill on her DEPO-PROVERA. Sent to [facility] in [place] . She has an appt Monday at 10:45

## 2019-12-07 NOTE — Telephone Encounter (Signed)
Rx Depo, see orders.    Gunnar Bulla, CNM [facility], [facility] 12/07/19 2:11 PM

## 2019-12-10 ENCOUNTER — Ambulatory Visit: Payer: Medicaid Other | Admitting: Certified Nurse Midwife

## 2019-12-10 ENCOUNTER — Telehealth: Payer: Self-pay | Admitting: Certified Nurse Midwife

## 2019-12-10 ENCOUNTER — Ambulatory Visit: Payer: Medicaid Other

## 2019-12-10 NOTE — Telephone Encounter (Signed)
Patient called in saying she has herpes type 2, patient is having a flare up causing vaginal lesions as the patient states and irritation. Could you please advise.

## 2019-12-10 NOTE — Telephone Encounter (Signed)
Please contact patient and advise her to take medication twice a day for a week. Also, let her know that she has yeast and BV, prescribe desired treatment. Inquire as to why she continues to miss depo appointment. Is she planning on getting next injection with annual in two weeks? Thanks, JML

## 2019-12-11 ENCOUNTER — Telehealth: Payer: Self-pay | Admitting: Certified Nurse Midwife

## 2019-12-11 NOTE — Telephone Encounter (Signed)
Patient called in saying she never received a call back from the nurse. Patient states she is in distress. Could you please advise?

## 2019-12-12 NOTE — Telephone Encounter (Signed)
Pt called no answer unable to LM due to VM not set up. Sent pt a Wellsite geologist.

## 2019-12-14 ENCOUNTER — Other Ambulatory Visit: Payer: Self-pay | Admitting: Physician Assistant

## 2019-12-24 ENCOUNTER — Other Ambulatory Visit: Payer: Self-pay

## 2019-12-24 ENCOUNTER — Other Ambulatory Visit (HOSPITAL_COMMUNITY)
Admission: RE | Admit: 2019-12-24 | Discharge: 2019-12-24 | Disposition: A | Discharge status: Home / Self Care | Payer: Medicaid Other | Source: Ambulatory Visit | Attending: Certified Nurse Midwife | Admitting: Certified Nurse Midwife

## 2019-12-24 ENCOUNTER — Ambulatory Visit (INDEPENDENT_AMBULATORY_CARE_PROVIDER_SITE_OTHER): Payer: Medicaid Other | Admitting: Certified Nurse Midwife

## 2019-12-24 ENCOUNTER — Encounter: Payer: Self-pay | Admitting: Certified Nurse Midwife

## 2019-12-24 VITALS — BP 114/74 | HR 83 | Ht 66.0 in | Wt 145.1 lb

## 2019-12-24 DIAGNOSIS — Z01419 Encounter for gynecological examination (general) (routine) without abnormal findings: Secondary | ICD-10-CM | POA: Diagnosis not present

## 2019-12-24 DIAGNOSIS — Z0000 Encounter for general adult medical examination without abnormal findings: Secondary | ICD-10-CM | POA: Diagnosis not present

## 2019-12-24 DIAGNOSIS — Z8742 Personal history of other diseases of the female genital tract: Secondary | ICD-10-CM | POA: Insufficient documentation

## 2019-12-24 DIAGNOSIS — Z3042 Encounter for surveillance of injectable contraceptive: Secondary | ICD-10-CM

## 2019-12-24 LAB — POCT URINE PREGNANCY: Preg Test, Ur: NEGATIVE

## 2019-12-24 MED ORDER — MEDROXYPROGESTERONE ACETATE 150 MG/ML IM SUSP
150.0000 mg | Freq: Once | INTRAMUSCULAR | Status: AC
  Administered 2019-12-24: 150 mg via INTRAMUSCULAR

## 2019-12-24 NOTE — Patient Instructions (Signed)
Medroxyprogesterone injection [Contraceptive] What is this medicine? MEDROXYPROGESTERONE (me DROX ee proe JES te rone) contraceptive injections prevent pregnancy. They provide effective birth control for 3 months. Depo-subQ Provera 104 is also used for treating pain related to endometriosis. This medicine may be used for other purposes; ask your health care provider or pharmacist if you have questions. COMMON BRAND NAME(S): Depo-Provera, Depo-subQ Provera 104 What should I tell my health care provider before I take this medicine? They need to know if you have any of these conditions:  frequently drink alcohol  asthma  blood vessel disease or a history of a blood clot in the lungs or legs  bone disease such as osteoporosis  breast cancer  diabetes  eating disorder (anorexia nervosa or bulimia)  high blood pressure  HIV infection or AIDS  kidney disease  liver disease  mental depression  migraine  seizures (convulsions)  stroke  tobacco smoker  vaginal bleeding  an unusual or allergic reaction to medroxyprogesterone, other hormones, medicines, foods, dyes, or preservatives  pregnant or trying to get pregnant  breast-feeding How should I use this medicine? Depo-Provera Contraceptive injection is given into a muscle. Depo-subQ Provera 104 injection is given under the skin. These injections are given by a health care professional. You must not be pregnant before getting an injection. The injection is usually given during the first 5 days after the start of a menstrual period or 6 weeks after delivery of a baby. Talk to your pediatrician regarding the use of this medicine in children. Special care may be needed. These injections have been used in female children who have started having menstrual periods. Overdosage: If you think you have taken too much of this medicine contact a poison control center or emergency room at once. NOTE: This medicine is only for you. Do not  share this medicine with others. What if I miss a dose? Try not to miss a dose. You must get an injection once every 3 months to maintain birth control. If you cannot keep an appointment, call and reschedule it. If you wait longer than 13 weeks between Depo-Provera contraceptive injections or longer than 14 weeks between Depo-subQ Provera 104 injections, you could get pregnant. Use another method for birth control if you miss your appointment. You may also need a pregnancy test before receiving another injection. What may interact with this medicine? Do not take this medicine with any of the following medications:  bosentan This medicine may also interact with the following medications:  aminoglutethimide  antibiotics or medicines for infections, especially rifampin, rifabutin, rifapentine, and griseofulvin  aprepitant  barbiturate medicines such as phenobarbital or primidone  bexarotene  carbamazepine  medicines for seizures like ethotoin, felbamate, oxcarbazepine, phenytoin, topiramate  modafinil  St. John's wort This list may not describe all possible interactions. Give your health care provider a list of all the medicines, herbs, non-prescription drugs, or dietary supplements you use. Also tell them if you smoke, drink alcohol, or use illegal drugs. Some items may interact with your medicine. What should I watch for while using this medicine? This drug does not protect you against HIV infection (AIDS) or other sexually transmitted diseases. Use of this product may cause you to lose calcium from your bones. Loss of calcium may cause weak bones (osteoporosis). Only use this product for more than 2 years if other forms of birth control are not right for you. The longer you use this product for birth control the more likely you will be at risk  for weak bones. Ask your health care professional how you can keep strong bones. You may have a change in bleeding pattern or irregular periods.  Many females stop having periods while taking this drug. If you have received your injections on time, your chance of being pregnant is very low. If you think you may be pregnant, see your health care professional as soon as possible. Tell your health care professional if you want to get pregnant within the next year. The effect of this medicine may last a long time after you get your last injection. What side effects may I notice from receiving this medicine? Side effects that you should report to your doctor or health care professional as soon as possible:  allergic reactions like skin rash, itching or hives, swelling of the face, lips, or tongue  breast tenderness or discharge  breathing problems  changes in vision  depression  feeling faint or lightheaded, falls  fever  pain in the abdomen, chest, groin, or leg  problems with balance, talking, walking  unusually weak or tired  yellowing of the eyes or skin Side effects that usually do not require medical attention (report to your doctor or health care professional if they continue or are bothersome):  acne  fluid retention and swelling  headache  irregular periods, spotting, or absent periods  temporary pain, itching, or skin reaction at site where injected  weight gain This list may not describe all possible side effects. Call your doctor for medical advice about side effects. You may report side effects to FDA at 1-800-FDA-1088. Where should I keep my medicine? This does not apply. The injection will be given to you by a health care professional. NOTE: This sheet is a summary. It may not cover all possible information. If you have questions about this medicine, talk to your doctor, pharmacist, or health care provider.  2020 Elsevier/Gold Standard (2008-08-16 18:37:56)   Preventive Care 37-26 Years Old, Female Preventive care refers to lifestyle choices and visits with your health care provider that can promote  health and wellness. At this stage in your life, you may start seeing a primary care physician instead of a pediatrician. Your health care is now your responsibility. Preventive care for young adults includes:  A yearly physical exam. This is also called an annual wellness visit.  Regular dental and eye exams.  Immunizations.  Screening for certain conditions.  Healthy lifestyle choices, such as diet and exercise. What can I expect for my preventive care visit? Physical exam Your health care provider may check:  Height and weight. These may be used to calculate body mass index (BMI), which is a measurement that tells if you are at a healthy weight.  Heart rate and blood pressure.  Body temperature. Counseling Your health care provider may ask you questions about:  Past medical problems and family medical history.  Alcohol, tobacco, and drug use.  Home and relationship well-being.  Access to firearms.  Emotional well-being.  Diet, exercise, and sleep habits.  Sexual activity and sexual health.  Method of birth control.  Menstrual cycle.  Pregnancy history. What immunizations do I need?  Influenza (flu) vaccine  This is recommended every year. Tetanus, diphtheria, and pertussis (Tdap) vaccine  You may need a Td booster every 10 years. Varicella (chickenpox) vaccine  You may need this vaccine if you have not already been vaccinated. Human papillomavirus (HPV) vaccine  If recommended by your health care provider, you may need three doses over 6 months.  Measles, mumps, and rubella (MMR) vaccine  You may need at least one dose of MMR. You may also need a second dose. Meningococcal conjugate (MenACWY) vaccine  One dose is recommended if you are 17-20 years old and a Market researcher living in a residence hall, or if you have one of several medical conditions. You may also need additional booster doses. Pneumococcal conjugate (PCV13) vaccine  You may  need this if you have certain conditions and were not previously vaccinated. Pneumococcal polysaccharide (PPSV23) vaccine  You may need one or two doses if you smoke cigarettes or if you have certain conditions. Hepatitis A vaccine  You may need this if you have certain conditions or if you travel or work in places where you may be exposed to hepatitis A. Hepatitis B vaccine  You may need this if you have certain conditions or if you travel or work in places where you may be exposed to hepatitis B. Haemophilus influenzae type b (Hib) vaccine  You may need this if you have certain risk factors. You may receive vaccines as individual doses or as more than one vaccine together in one shot (combination vaccines). Talk with your health care provider about the risks and benefits of combination vaccines. What tests do I need? Blood tests  Lipid and cholesterol levels. These may be checked every 5 years starting at age 68.  Hepatitis C test.  Hepatitis B test. Screening  Pelvic exam and Pap test. This may be done every 3 years starting at age 58.  Sexually transmitted disease (STD) testing, if you are at risk.  BRCA-related cancer screening. This may be done if you have a family history of breast, ovarian, tubal, or peritoneal cancers. Other tests  Tuberculosis skin test.  Vision and hearing tests.  Skin exam.  Breast exam. Follow these instructions at home: Eating and drinking   Eat a diet that includes fresh fruits and vegetables, whole grains, lean protein, and low-fat dairy products.  Drink enough fluid to keep your urine pale yellow.  Do not drink alcohol if: ? Your health care provider tells you not to drink. ? You are pregnant, may be pregnant, or are planning to become pregnant. ? You are under the legal drinking age. In the [place] the legal drinking age is 90.  If you drink alcohol: ? Limit how much you have to 0-1 drink a day. ? Be aware of how much alcohol is in  your drink. In the [place] one drink equals one 12 oz bottle of beer (355 mL), one 5 oz glass of wine (148 mL), or one 1 oz glass of hard liquor (44 mL). Lifestyle  Take daily care of your teeth and gums.  Stay active. Exercise at least 30 minutes 5 or more days of the week.  Do not use any products that contain nicotine or tobacco, such as cigarettes, e-cigarettes, and chewing tobacco. If you need help quitting, ask your health care provider.  Do not use drugs.  If you are sexually active, practice safe sex. Use a condom or other form of birth control (contraception) in order to prevent pregnancy and STIs (sexually transmitted infections). If you plan to become pregnant, see your health care provider for a pre-conception visit.  Find healthy ways to cope with stress, such as: ? Meditation, yoga, or listening to music. ? Journaling. ? Talking to a trusted person. ? Spending time with friends and family. Safety  Always wear your seat belt while driving or riding  in a vehicle.  Do not drive if you have been drinking alcohol. Do not ride with someone who has been drinking.  Do not drive when you are tired or distracted. Do not text while driving.  Wear a helmet and other protective equipment during sports activities.  If you have firearms in your house, make sure you follow all gun safety procedures.  Seek help if you have been bullied, physically abused, or sexually abused.  Use the Internet responsibly to avoid dangers such as online bullying and online sex predators. What's next?  Go to your health care provider once a year for a well check visit.  Ask your health care provider how often you should have your eyes and teeth checked.  Stay up to date on all vaccines. This information is not intended to replace advice given to you by your health care provider. Make sure you discuss any questions you have with your health care provider. Document Revised: 07/20/2018 Document  Reviewed: 07/20/2018 Elsevier Patient Education  2020 [employer].

## 2019-12-24 NOTE — Progress Notes (Signed)
I have seen, interviewed, and examined the patient in conjunction with the [employer] Nurse Practitioner student and affirm the diagnosis and management plan.   Gunnar Bulla, CNM [facility], [facility] 12/24/19 1:18 PM

## 2019-12-24 NOTE — Progress Notes (Addendum)
ANNUAL PREVENTATIVE CARE GYN  ENCOUNTER NOTE  Subjective:       Theresa Roberson is a 19 y.o. G0P0000 female here for a routine annual gynecologic exam.  Current complaints: 1. TOC for BV and Yeast 2. Would STI testing  Doing well. 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 Completed treatment for BV and yeast and would like to be re-swabbed today to make sure infection is gone. Nervous about basic training because she can not take her medication while there.   Denies difficulty breathing or respiratory distress, chest pain, abdominal pain, excessive vaginal bleeding, dysuria, leg pain or swelling   Gynecologic History  Patient's last menstrual period was 12/21/2019.   Contraception: Depo-Provera injections   Obstetric History  OB History  Gravida Para Term Preterm AB Living  0 0 0 0 0 0  SAB TAB Ectopic Multiple Live Births  0 0 0 0 0    Past Medical History:  Diagnosis Date  . Genital herpes 12/2018  . HSV (herpes simplex virus) infection     History reviewed. No pertinent surgical history.  Current Outpatient Medications on File Prior to Visit  Medication Sig Dispense Refill  . acyclovir (ZOVIRAX) 800 MG tablet TAKE 1 TABLET BY MOUTH EVERY DAY 31 tablet 12  . clindamycin (CLEOCIN-T) 1 % lotion Apply topically 2 (two) times daily. 60 mL 0  . medroxyPROGESTERone (DEPO-PROVERA) 150 MG/ML injection Inject 1 mL (150 mg total) into the muscle every 3 (three) months. 1 mL 3  . acetaminophen (TYLENOL) 500 MG tablet Take 1 tablet (500 mg total) by mouth every 6 (six) hours as needed. (Patient not taking: Reported on 12/24/2019) 30 tablet 0   Current Facility-Administered Medications on File Prior to Visit  Medication Dose Route Frequency Provider Last Rate Last Admin  . medroxyPROGESTERone (DEPO-PROVERA) injection 150 mg  150 mg Intramuscular Q90 days Hampton, Carla J, [place]        No Known Allergies  Social History   Socioeconomic History  . Marital status:  Single    Spouse name: Not on file  . Number of children: 0  . Years of education: 73  . Highest education level: High school graduate  Occupational History  . Not on file  Tobacco Use  . Smoking status: Never Smoker  . Smokeless tobacco: Never Used  Substance and Sexual Activity  . Alcohol use: Not Currently    Alcohol/week: 1.0 standard drinks    Types: 1 Glasses of wine per week  . Drug use: Never  . Sexual activity: Yes    Birth control/protection: Injection  Other Topics Concern  . Not on file  Social History Narrative   Patient lives with her mom and her two younger brothers. She has a very close relationship with her mom and god father. Her dad is in her life but she doesn't have the close relationship she desires to have with him. Patient has a lot of supportive friendships. She has one best friend and was best friends with one of her sisters, but they are no longer talking at this time.    Social Determinants of Health   Financial Resource Strain:   . Difficulty of Paying Living Expenses:   Food Insecurity:   . Worried About Programme researcher, broadcasting/film/video in the Last Year:   . Barista in the Last Year:   Transportation Needs:   . Freight forwarder (Medical):   Marland Kitchen Lack of Transportation (Non-Medical):  Physical Activity:   . Days of Exercise per Week:   . Minutes of Exercise per Session:   Stress:   . Feeling of Stress :   Social Connections: Unknown  . Frequency of Communication with Friends and Family: More than three times a week  . Frequency of Social Gatherings with Friends and Family: More than three times a week  . Attends Religious Services: Not on file  . Active Member of Clubs or Organizations: No  . Attends Archivist Meetings: Never  . Marital Status: Never married  Intimate Partner Violence:   . Fear of Current or Ex-Partner:   . Emotionally Abused:   Marland Kitchen Physically Abused:   . Sexually Abused:     Family History  Problem Relation Age  of Onset  . Breast cancer Neg Hx   . Ovarian cancer Neg Hx   . Colon cancer Neg Hx     The following portions of the patient's history were reviewed and updated as appropriate: allergies, current medications, past family history, past medical history, past social history, past surgical history and problem list.  Review of Systems  ROS- negative except as noted above. Information obtained from patient.   Objective:   BP 114/74   Pulse 83   Ht 5\' 6"  (1.676 m)   Wt 145 lb 1.6 oz (65.8 kg)   LMP 12/21/2019   BMI 23.42 kg/m   CONSTITUTIONAL: Well-developed, well-nourished  female in no acute distress.   PSYCHIATRIC: Normal mood and affect. Normal behavior. Normal judgment and thought content.  [place]: Alert and oriented to person, place, and time. Normal muscle tone coordination. No cranial nerve deficit noted.  HENT:  Normocephalic, atraumatic, External right and left ear normal. Oropharynx is clear and moist  EYES: Conjunctivae and EOM are normal. No scleral icterus.   NECK: Normal range of motion, supple, no masses.  Normal thyroid.   SKIN: Skin is warm and dry. No rash noted. Not diaphoretic. No erythema. No pallor.  CARDIOVASCULAR: Normal heart rate noted, regular rhythm, no murmur.  RESPIRATORY: Clear to auscultation bilaterally. Effort and breath sounds normal, no problems with respiration noted.  BREASTS: Symmetric in size. No masses, skin changes, nipple drainage, or lymphadenopathy.  ABDOMEN: Soft, normal bowel sounds, no distention noted.  No tenderness, rebound or guarding.   PELVIC:  External Genitalia: Normal   MUSCULOSKELETAL: Normal range of motion. No tenderness.  No cyanosis, clubbing, or edema.  2+ distal pulses.  LYMPHATIC: No Axillary, Supraclavicular, or Inguinal Adenopathy.  Assessment:   Annual gynecologic examination 19 y.o.   Contraception: Depo-Provera injections   Normal BMI   Problem List Items Addressed This Visit    None     Visit Diagnoses    Well woman exam    -  Primary   Relevant Orders   Cervicovaginal ancillary only   Depo-Provera contraceptive status       Relevant Orders   POCT urine pregnancy (Completed)   History of vaginitis       Relevant Orders   Cervicovaginal ancillary only      Plan:   Pap: Not needed  Labs: See Orders.  Depo injection given today, see chart.   Routine preventative health maintenance measures emphasized: Exercise/Diet/Weight control, Tobacco Warnings, Alcohol/Substance use risks, Stress Management, Peer Pressure Issues and Safe Sex. See AVS.  Return to [facility] for [facility] or sooner if needed.   Fransico Him RN [place] 12/24/19 10:03 AM

## 2019-12-24 NOTE — Progress Notes (Signed)
Patient comes in today for annual exam. She has not had Depo injection since February . UPT done today that was negative.

## 2019-12-25 LAB — CERVICOVAGINAL ANCILLARY ONLY
Bacterial Vaginitis (gardnerella): NEGATIVE
Candida Glabrata: NEGATIVE
Candida Vaginitis: NEGATIVE
Chlamydia: NEGATIVE
Comment: NEGATIVE
Comment: NEGATIVE
Comment: NEGATIVE
Comment: NEGATIVE
Comment: NEGATIVE
Comment: NORMAL
Neisseria Gonorrhea: NEGATIVE
Trichomonas: NEGATIVE

## 2020-01-30 NOTE — Telephone Encounter (Signed)
Pt called no answer unable to leave message due to voicemail box has not been set up.

## 2020-02-07 NOTE — Telephone Encounter (Signed)
Pt called numbers on file both are busy. cellphone number has a vm that has not been set up.

## 2021-01-28 ENCOUNTER — Other Ambulatory Visit: Payer: Medicaid Other | Admitting: Certified Nurse Midwife

## 2021-01-28 ENCOUNTER — Other Ambulatory Visit: Payer: Self-pay | Admitting: Physician Assistant

## 2021-01-28 NOTE — Telephone Encounter (Signed)
*  STAT* If patient is at the pharmacy, call can be transferred to refill team.   1. Which medications need to be refilled? (please list name of each medication and dose if known)     ACYCLOVIR  2. Which pharmacy/location (including street and city if local pharmacy) is medication to be sent to?  [facility] [place]  3. Do they need a 30 day or 90 day supply?  30 DAY SUPPLY- PT STATES THE PRESCRIPTION IS EXPIRED.

## 2021-01-28 NOTE — Telephone Encounter (Signed)
Patient is in need of her medication. Her mother is calling in regards to trying to get her medication filled for her because patient  is currently stationed in [place]. Unable to get refills through the health department. Mom wants to know if you can help her. I informed mom that you were out of the office and I will contact her tomorrow with response.

## 2021-01-28 NOTE — Telephone Encounter (Signed)
Per chart review, last visit was in 2020.

## 2021-01-30 MED ORDER — ACYCLOVIR 800 MG PO TABS: 800.0000 mg | ORAL_TABLET | Freq: Every day | ORAL | 3 refills | Status: DC

## 2022-03-23 ENCOUNTER — Other Ambulatory Visit: Payer: Self-pay

## 2022-03-23 MED ORDER — ACYCLOVIR 800 MG PO TABS: 800.0000 mg | ORAL_TABLET | Freq: Every day | ORAL | 3 refills | Status: AC

## 2023-07-30 ENCOUNTER — Emergency Department (HOSPITAL_COMMUNITY): Payer: Medicaid Other

## 2023-07-30 ENCOUNTER — Emergency Department (HOSPITAL_COMMUNITY)
Admission: EM | Admit: 2023-07-30 | Discharge: 2023-07-30 | Disposition: A | Discharge status: Home / Self Care | Payer: Medicaid Other | Attending: Emergency Medicine | Admitting: Emergency Medicine

## 2023-07-30 DIAGNOSIS — W108XXA Fall (on) (from) other stairs and steps, initial encounter: Secondary | ICD-10-CM | POA: Diagnosis not present

## 2023-07-30 DIAGNOSIS — Z23 Encounter for immunization: Secondary | ICD-10-CM | POA: Insufficient documentation

## 2023-07-30 DIAGNOSIS — S0083XA Contusion of other part of head, initial encounter: Secondary | ICD-10-CM | POA: Insufficient documentation

## 2023-07-30 DIAGNOSIS — M79671 Pain in right foot: Secondary | ICD-10-CM | POA: Diagnosis not present

## 2023-07-30 DIAGNOSIS — S92535A Nondisplaced fracture of distal phalanx of left lesser toe(s), initial encounter for closed fracture: Secondary | ICD-10-CM | POA: Insufficient documentation

## 2023-07-30 DIAGNOSIS — S92502A Displaced unspecified fracture of left lesser toe(s), initial encounter for closed fracture: Secondary | ICD-10-CM

## 2023-07-30 DIAGNOSIS — M25512 Pain in left shoulder: Secondary | ICD-10-CM | POA: Diagnosis not present

## 2023-07-30 DIAGNOSIS — S0990XA Unspecified injury of head, initial encounter: Secondary | ICD-10-CM

## 2023-07-30 DIAGNOSIS — M79675 Pain in left toe(s): Secondary | ICD-10-CM | POA: Diagnosis present

## 2023-07-30 LAB — POC URINE PREG, ED: Preg Test, Ur: NEGATIVE

## 2023-07-30 MED ORDER — HYDROCODONE-ACETAMINOPHEN 5-325 MG PO TABS
1.0000 | ORAL_TABLET | Freq: Once | ORAL | Status: AC
  Administered 2023-07-30: 1 via ORAL
  Filled 2023-07-30: qty 1

## 2023-07-30 MED ORDER — NAPROXEN 250 MG PO TABS
500.0000 mg | ORAL_TABLET | Freq: Once | ORAL | Status: AC
  Administered 2023-07-30: 500 mg via ORAL
  Filled 2023-07-30: qty 2

## 2023-07-30 MED ORDER — NAPROXEN 375 MG PO TABS: 375.0000 mg | ORAL_TABLET | Freq: Two times a day (BID) | ORAL | 0 refills | Status: DC

## 2023-07-30 MED ORDER — TETANUS-DIPHTH-ACELL PERTUSSIS 5-2.5-18.5 LF-MCG/0.5 IM SUSY
0.5000 mL | PREFILLED_SYRINGE | Freq: Once | INTRAMUSCULAR | Status: AC
  Administered 2023-07-30: 0.5 mL via INTRAMUSCULAR
  Filled 2023-07-30: qty 0.5

## 2023-07-30 MED ORDER — HYDROCODONE-ACETAMINOPHEN 5-325 MG PO TABS: 1.0000 | ORAL_TABLET | ORAL | 0 refills | Status: DC | PRN

## 2023-07-30 NOTE — Discharge Instructions (Addendum)
Thank you for allowing [place] to be a part of your care today.  You were evaluated in the ED for injuries related to a fall.  Your left foot x-ray revealed a small fracture to the end of your 2nd toe (where you have bruising and swelling).  Keep this toe buddy taped to help with healing.  You may ice any of your injuries 2-3 times per day for 20 minutes at a time over the next 24-48 hours.  After this time, transition to warm compresses or using heat on these areas.   I have prescribed a anti-inflammatory for you to take twice daily (naproxen).  I have also sent in a short course of pain medicine to take for severe or breakthrough pain.  Do not consume alcohol, drive, operate heavy machinery, or perform other tasks that require you to be completely alert while taking this medication as it is often sedating.   Use the crutches as needed to help with weight bearing on your right foot.  Gradually increase weight bearing as tolerated.  Follow up with your primary care provider or orthopedic provider if the symptoms do not improve with time and treatment.  I have provided orthopedic referral information.   I have attached information regarding signs and symptoms of a concussion as well as what to do if you develop any of these.  I do recommend following up with your doctor if you develop concussion symptoms.  A concussion can develop after any head injury, even if you did not lose consciousness.    Return to the ED if you develop sudden worsening of your symptoms or if you have new concerns.

## 2023-07-30 NOTE — ED Provider Triage Note (Signed)
Emergency Medicine Provider Triage Evaluation Note  Theresa Roberson , a 22 y.o. female  was evaluated in triage.  Pt complains of fall. Report she fellt down the steps last night after she drank alcohol at her friends house.  She endorse pain to forehead, L shoulder, and both feet.  Unsure LOC.  Denies being physically abused or feeling unsafe.  Noted to have bruising to both hands on the dorsal aspect, but denies getting into a fight  Review of Systems  Positive: As above Negative: As above  Physical Exam  There were no vitals taken for this visit. Gen:   Awake, no distress   Resp:  Normal effort  MSK:   Moves extremities without difficulty  Other:    Medical Decision Making  Medically screening exam initiated at 2:57 PM.  Appropriate orders placed.  Jess Barters was informed that the remainder of the evaluation will be completed by another provider, this initial triage assessment does not replace that evaluation, and the importance of remaining in the ED until their evaluation is complete.     Fayrene Helper, PA-C 07/30/23 [phone]

## 2023-07-30 NOTE — ED Triage Notes (Signed)
Pt reports having a fall down a flight of stairs last night. Pt c/o L second toe pain, R heel pain, L shoulder pain. Pt also has reddened area on her forehead with a small laceration on her eyebrow. Pt making poor eye contact during triage, but states that she was not assaulted.

## 2023-07-30 NOTE — ED Provider Notes (Signed)
[place] EMERGENCY DEPARTMENT AT [facility] Provider Note   CSN: [id number] Arrival date & time: 07/30/23  1426     History  Chief Complaint  Patient presents with   Theresa Roberson is a 22 y.o. female without significant past medical history presents to the ED complaining of left second toe pain, right heel pain, and left shoulder pain.  She reports that she fell down a flight of stairs last night after she consumed alcohol at a friends house.  She also endorses pain to her forehead and a mild headache.  Denies loss of consciousness.  She states she has significant pain with weight bearing on her right foot.  Patient has not taken any medications for her symptoms.  Denies neck pain, back pain, nausea, vomiting.         Home Medications Prior to Admission medications   Medication Sig Start Date End Date Taking? Authorizing Provider  HYDROcodone-acetaminophen (NORCO/VICODIN) 5-325 MG tablet Take 1 tablet by mouth every 4 (four) hours as needed. 07/30/23  Yes Jazzy Parmer R, PA-C  naproxen (NAPROSYN) 375 MG tablet Take 1 tablet (375 mg total) by mouth 2 (two) times daily. 07/30/23  Yes Krisanne Lich R, PA-C  acetaminophen (TYLENOL) 500 MG tablet Take 1 tablet (500 mg total) by mouth every 6 (six) hours as needed. Patient not taking: Reported on 12/24/2019 07/22/19   Enid Derry, PA-C  acyclovir (ZOVIRAX) 800 MG tablet Take 1 tablet (800 mg total) by mouth daily. 03/23/22   Hildred Laser, MD  clindamycin (CLEOCIN-T) 1 % lotion Apply topically 2 (two) times daily. 10/08/19   Lawhorn, Vanessa [place], CNM  medroxyPROGESTERone (DEPO-PROVERA) 150 MG/ML injection Inject 1 mL (150 mg total) into the muscle every 3 (three) months. 12/07/19   Gunnar Bulla, CNM      Allergies    Patient has no known allergies.    Review of Systems   Review of Systems  Gastrointestinal:  Negative for nausea and vomiting.  Musculoskeletal:  Negative for back pain and neck  pain.  Skin:  Positive for wound.  Neurological:  Positive for headaches. Negative for syncope.    Physical Exam Updated Vital Signs BP 112/76   Pulse 77   Temp 98.9 F (37.2 C) (Oral)   Resp 17   SpO2 100%  Physical Exam Vitals and nursing note reviewed.  Constitutional:      General: She is not in acute distress.    Appearance: Normal appearance. She is not ill-appearing or diaphoretic.  HENT:     Head: Normocephalic. Contusion present.     Comments: Abrasion and contusion to the left forehead.  Another abrasion with contusion appreciated to right forehead/temporal region.  Mild tenderness over these areas.  No palpable skull fractures.   Cardiovascular:     Rate and Rhythm: Normal rate and regular rhythm.  Pulmonary:     Effort: Pulmonary effort is normal.  Musculoskeletal:     Left shoulder: Tenderness present. No swelling, deformity or bony tenderness. Normal range of motion. Normal strength. Normal pulse.     Cervical back: Full passive range of motion without pain. No spinous process tenderness or muscular tenderness.     Right ankle: Tenderness present over the lateral malleolus and medial malleolus.     Right foot: Normal capillary refill. Tenderness (heel) and bony tenderness (heel) present. No swelling or deformity. Normal pulse.     Left foot: Decreased range of motion (2nd toe). Normal capillary refill. Swelling (  2nd toe) and tenderness present. No deformity. Normal pulse.     Comments: Multiple small abrasions to both feet.  Swelling, ecchymosis, and dried blood to distal left 2nd toe.   Skin:    General: Skin is warm and dry.     Capillary Refill: Capillary refill takes less than 2 seconds.  Neurological:     Mental Status: She is alert and oriented to person, place, and time. Mental status is at baseline.  Psychiatric:        Mood and Affect: Mood normal.        Behavior: Behavior normal.     ED Results / Procedures / Treatments   Labs (all labs ordered  are listed, but only abnormal results are displayed) Labs Reviewed  POC URINE PREG, ED    EKG None  Radiology CT Head Wo Contrast Result Date: 07/30/2023 CLINICAL DATA:  Trauma EXAM: CT HEAD WITHOUT CONTRAST TECHNIQUE: Contiguous axial images were obtained from the base of the skull through the vertex without intravenous contrast. RADIATION DOSE REDUCTION: This exam was performed according to the departmental dose-optimization program which includes automated exposure control, adjustment of the mA and/or kV according to patient size and/or use of iterative reconstruction technique. COMPARISON:  None Available. FINDINGS: Brain: No evidence of acute infarction, hemorrhage, hydrocephalus, extra-axial collection or mass lesion/mass effect. Vascular: No hyperdense vessel or unexpected calcification. Skull: Normal. Negative for fracture or focal lesion. Sinuses/Orbits: No acute finding. Other: None. IMPRESSION: No acute intracranial pathology. Electronically Signed   By: Jearld Lesch M.D.   On: 07/30/2023 16:19   DG Foot Complete Left Result Date: 07/30/2023 CLINICAL DATA:  injury EXAM: LEFT FOOT - COMPLETE 3+ VIEW COMPARISON:  None Available. FINDINGS: Subtle cortical irregularity at the medial base of the distal phalanx in left second toe on the oblique view, cannot exclude a nondisplaced fracture in this location. Otherwise no fracture. No dislocation. Lisfranc joint appears intact. No focal osseous lesions. No significant arthropathy. No radiopaque foreign bodies. IMPRESSION: Subtle cortical irregularity at the medial base of the distal phalanx in left second toe on the oblique view, cannot exclude a nondisplaced fracture in this location. Correlate with directed clinical exam. Electronically Signed   By: Delbert Phenix M.D.   On: 07/30/2023 16:08   DG Shoulder Left Result Date: 07/30/2023 CLINICAL DATA:  injury EXAM: LEFT SHOULDER - 2+ VIEW COMPARISON:  None Available. FINDINGS: There is no evidence  of fracture or dislocation. No evidence of acromioclavicular separation. There is no evidence of arthropathy or other focal bone abnormality. Soft tissues are unremarkable. IMPRESSION: No left shoulder fracture or malalignment. Electronically Signed   By: Delbert Phenix M.D.   On: 07/30/2023 16:05   DG Foot Complete Right Result Date: 07/30/2023 CLINICAL DATA:  injury EXAM: RIGHT FOOT COMPLETE - 3+ VIEW COMPARISON:  None Available. FINDINGS: There is no evidence of fracture or dislocation. Lisfranc joint appears intact. There is no evidence of arthropathy or other focal bone abnormality. Soft tissues are unremarkable. IMPRESSION: Negative. Electronically Signed   By: Delbert Phenix M.D.   On: 07/30/2023 16:04    Procedures Procedures    Medications Ordered in ED Medications  naproxen (NAPROSYN) tablet 500 mg (has no administration in time range)  HYDROcodone-acetaminophen (NORCO/VICODIN) 5-325 MG per tablet 1 tablet (has no administration in time range)  Tdap (BOOSTRIX) injection 0.5 mL (0.5 mLs Intramuscular Given 07/30/23 1755)    ED Course/ Medical Decision Making/ A&P  Medical Decision Making  This patient presents to the ED with chief complaint(s) of injuries from fall down stairs with non-contributory past medical history.  The complaint involves an extensive differential diagnosis and also carries with it a high risk of complications and morbidity.    The differential diagnosis includes acute fracture or dislocation; acute intracranial injury, skull fracture, contusion, muscle strain   The initial plan is to imaging ordered during triage  Initial Assessment:   On exam, patient is resting comfortably in bed and does not appear to be in acute distress.  Abrasions appreciated to both feet.  Tenderness to palpation of the right heel without obvious deformity or ecchymosis.  Mild tenderness over lateral and medial right ankle without obvious swelling,  deformity, or ecchymosis.  DP pulses are 2+ bilaterally.  Left 2nd toe with swelling, ecchymosis, and dried blood around the nail bed.  Digits of both feet are neurovascularly intact.  She is able to move all toes appropriately with exception of left 2nd toe.  Abrasions and contusions appreciated to the right and left forehead.  No palpable skull fractures.  Full ROM of the neck without pain.  No midline tenderness.  Generalized tenderness to left shoulder without obvious swelling or deformity.  Independent visualization and interpretation of imaging: I independently visualized the following imaging with scope of interpretation limited to determining acute life threatening conditions related to emergency care: CT head, which revealed no acute intracranial injury.   X-rays of left shoulder and right foot without acute fracture or dislocation.  Left foot x-ray with subtle cortical irregularity at medial base of distal phalanx in left 2nd toe.   Treatment and Reassessment: Patient given 500 mg naproxen and hydrocodone for pain.  Will have left 2nd toe buddy taped and ACE wrap applied to right foot/ankle.  Patient will be provided with crutches to help with weight bearing.    Disposition:   Will send patient home with prescription anti-inflammatory and short course of pain medicine to use for severe or breakthrough pain.  Recommended PCP follow up if she has persistent symptoms.  Patient also provided with orthopedic follow up if heel pain does not improve.  Concussion information provided should patient develop any signs/symptoms.   The patient has been appropriately medically screened and/or stabilized in the ED. I have low suspicion for any other emergent medical condition which would require further screening, evaluation or treatment in the ED or require inpatient management. At time of discharge the patient is hemodynamically stable and in no acute distress. I have discussed work-up results and  diagnosis with patient and answered all questions. Patient is agreeable with discharge plan. We discussed strict return precautions for returning to the emergency department and they verbalized understanding.             Final Clinical Impression(s) / ED Diagnoses Final diagnoses:  Fall down stairs, initial encounter  Closed fracture of phalanx of left second toe, initial encounter  Pain of right heel  Acute pain of left shoulder  Injury of head, initial encounter    Rx / DC Orders ED Discharge Orders          Ordered    HYDROcodone-acetaminophen (NORCO/VICODIN) 5-325 MG tablet  Every 4 hours PRN        07/30/23 1900    naproxen (NAPROSYN) 375 MG tablet  2 times daily        07/30/23 1900              Shifa Brisbon,  [place]ena Morrow, PA-C 07/30/23 1903    Rolan Bucco, MD 07/30/23 2321

## 2023-12-25 ENCOUNTER — Inpatient Hospital Stay (HOSPITAL_COMMUNITY)

## 2023-12-25 ENCOUNTER — Encounter (HOSPITAL_COMMUNITY): Payer: Self-pay | Admitting: Obstetrics and Gynecology

## 2023-12-25 ENCOUNTER — Other Ambulatory Visit: Payer: Self-pay

## 2023-12-25 ENCOUNTER — Inpatient Hospital Stay (HOSPITAL_COMMUNITY)
Admission: AD | Admit: 2023-12-25 | Discharge: 2023-12-26 | Disposition: A | Discharge status: Home / Self Care | Attending: Obstetrics and Gynecology | Admitting: Obstetrics and Gynecology

## 2023-12-25 DIAGNOSIS — N83201 Unspecified ovarian cyst, right side: Secondary | ICD-10-CM | POA: Insufficient documentation

## 2023-12-25 DIAGNOSIS — N83209 Unspecified ovarian cyst, unspecified side: Secondary | ICD-10-CM | POA: Insufficient documentation

## 2023-12-25 DIAGNOSIS — O3481 Maternal care for other abnormalities of pelvic organs, first trimester: Secondary | ICD-10-CM | POA: Insufficient documentation

## 2023-12-25 DIAGNOSIS — Z3A01 Less than 8 weeks gestation of pregnancy: Secondary | ICD-10-CM | POA: Insufficient documentation

## 2023-12-25 DIAGNOSIS — O209 Hemorrhage in early pregnancy, unspecified: Secondary | ICD-10-CM | POA: Diagnosis present

## 2023-12-25 DIAGNOSIS — O26891 Other specified pregnancy related conditions, first trimester: Secondary | ICD-10-CM | POA: Diagnosis not present

## 2023-12-25 LAB — RAPID HIV SCREEN (HIV 1/2 AB+AG)
HIV 1/2 Antibodies: NONREACTIVE
HIV-1 P24 Antigen - HIV24: NONREACTIVE

## 2023-12-25 LAB — URINALYSIS, ROUTINE W REFLEX MICROSCOPIC
Bilirubin Urine: NEGATIVE
Glucose, UA: NEGATIVE mg/dL
Hgb urine dipstick: NEGATIVE
Ketones, ur: NEGATIVE mg/dL
Leukocytes,Ua: NEGATIVE
Nitrite: NEGATIVE
Protein, ur: NEGATIVE mg/dL
Specific Gravity, Urine: 1.03 (ref 1.005–1.030)
pH: 6 (ref 5.0–8.0)

## 2023-12-25 LAB — HCG, QUANTITATIVE, PREGNANCY: hCG, Beta Chain, Quant, S: 84044 m[IU]/mL — ABNORMAL HIGH (ref <5)

## 2023-12-25 LAB — WET PREP, GENITAL
Clue Cells Wet Prep HPF POC: NONE SEEN
Sperm: NONE SEEN
Trich, Wet Prep: NONE SEEN
WBC, Wet Prep HPF POC: >=10 — AB (ref <10)
Yeast Wet Prep HPF POC: NONE SEEN

## 2023-12-25 LAB — CBC
HCT: 36.6 % (ref 36.0–46.0)
Hemoglobin: 12.5 g/dL (ref 12.0–15.0)
MCH: 32.1 pg (ref 26.0–34.0)
MCHC: 34.2 g/dL (ref 30.0–36.0)
MCV: 94.1 fL (ref 80.0–100.0)
Platelets: 241 10*3/uL (ref 150–400)
RBC: 3.89 MIL/uL (ref 3.87–5.11)
RDW: 11.7 % (ref 11.5–15.5)
WBC: 5.2 10*3/uL (ref 4.0–10.5)
nRBC: 0 % (ref 0.0–0.2)

## 2023-12-25 LAB — ABO/RH: ABO/RH(D): O POS

## 2023-12-25 LAB — POCT PREGNANCY, URINE: Preg Test, Ur: POSITIVE — AB

## 2023-12-25 MED ORDER — ACETAMINOPHEN 500 MG PO TABS
1000.0000 mg | ORAL_TABLET | Freq: Once | ORAL | Status: AC
  Administered 2023-12-25: 1000 mg via ORAL
  Filled 2023-12-25: qty 2

## 2023-12-25 NOTE — MAU Note (Signed)
 Theresa Roberson is a 23 y.o. at Unknown here in MAU reporting: cramping and VB today - light spotting. Denies taking medication for pain. No recent intercourse. Positive HPT a week ago.   LMP: march 30 Onset of complaint: 1600 Pain score: 6 Vitals:   12/25/23 2114  BP: 110/60  Pulse: 71  Resp: 18  Temp: 98.3 F (36.8 C)  SpO2: 100%     FHT: NA   Lab orders placed from triage: UPT

## 2023-12-25 NOTE — MAU Provider Note (Signed)
 Vaginal spotting     [name] is a 23 y.o. G2P0010 pregnant female at [redacted]w[redacted]d who presents to MAU today with complaint of vaginal spotting. Pt states +HPT a week ago and +UPT today in triage. Pt states light spotting today and denies coitus.  Reports pelvic pain that's menses like but not having to take medication for the pain. Pt states hx of miscarriage previously <[redacted] weeks gestation.   Pertinent items noted in HPI and remainder of comprehensive ROS otherwise negative.   O BP 110/60 (BP Location: Right Arm)   Pulse 71   Temp 98.3 F (36.8 C) (Oral)   Resp 18   Ht 5\' 5"  (1.651 m)   Wt 72.8 kg   LMP 11/06/2023 (Approximate)   SpO2 100%   BMI 26.73 kg/m  Physical Exam Vitals and nursing note reviewed.  Constitutional:      General: She is not in acute distress.    Appearance: She is well-developed and normal weight. She is not ill-appearing.  HENT:     Head: Normocephalic and atraumatic.     Mouth/Throat:     Mouth: Mucous membranes are moist.     Pharynx: Oropharynx is clear.  Eyes:     Extraocular Movements: Extraocular movements intact.  Cardiovascular:     Rate and Rhythm: Normal rate.  Pulmonary:     Effort: Pulmonary effort is normal. No respiratory distress.  Abdominal:     General: Abdomen is flat.     Palpations: Abdomen is soft.     Tenderness: There is no abdominal tenderness.  Skin:    General: Skin is warm and dry.  Neurological:     Mental Status: She is alert and oriented to person, place, and time.     Motor: No weakness.  Psychiatric:        Mood and Affect: Mood normal.        Behavior: Behavior normal.      MDM: MAU Course: [name] w/u: UA noninfectious, no hgb or proteinuria seen  CBC wnl hCG 84044 ABO/Rh O+, no rhogam   Wet Prep negative GC collected  TVUS = SIUP with fetal cardiac activity 109bpm, two R hemorrhagic ovarian cysts 5.9 and 4.4 (vs septated single cyst).   Pt with 0-1 out of 10 pain following tylenol .  Stable  for d/c.    AP #[redacted] weeks gestation #R hemorrhagic ovarian cysts  Discharge from MAU in stable condition with strict/usual precautions Follow up at desired OBGYN as scheduled for ongoing prenatal care  Allergies as of 12/26/2023   No Known Allergies      Medication List     STOP taking these medications    HYDROcodone -acetaminophen  5-325 MG tablet Commonly known as: NORCO/VICODIN   medroxyPROGESTERone  150 MG/ML injection Commonly known as: DEPO-PROVERA    naproxen  375 MG tablet Commonly known as: NAPROSYN        TAKE these medications    acetaminophen  500 MG tablet Commonly known as: TYLENOL  Take 1 tablet (500 mg total) by mouth every 6 (six) hours as needed.   acyclovir  800 MG tablet Commonly known as: ZOVIRAX  Take 1 tablet (800 mg total) by mouth daily.   clindamycin  1 % lotion Commonly known as: Cleocin -T Apply topically 2 (two) times daily.   ondansetron  4 MG disintegrating tablet Commonly known as: ZOFRAN -ODT Take 1 tablet (4 mg total) by mouth every 8 (eight) hours as needed for nausea or vomiting.        Ebony Goldstein, MD 12/26/2023 2:38  AM

## 2023-12-26 DIAGNOSIS — N83201 Unspecified ovarian cyst, right side: Secondary | ICD-10-CM

## 2023-12-26 DIAGNOSIS — Z3A01 Less than 8 weeks gestation of pregnancy: Secondary | ICD-10-CM

## 2023-12-26 DIAGNOSIS — O26891 Other specified pregnancy related conditions, first trimester: Secondary | ICD-10-CM

## 2023-12-26 LAB — RPR: RPR Ser Ql: NONREACTIVE

## 2023-12-26 LAB — GC/CHLAMYDIA PROBE AMP (~~LOC~~) NOT AT ARMC
Chlamydia: NEGATIVE
Comment: NEGATIVE
Comment: NORMAL
Neisseria Gonorrhea: NEGATIVE

## 2023-12-26 MED ORDER — ONDANSETRON 4 MG PO TBDP: 4.0000 mg | ORAL_TABLET | Freq: Three times a day (TID) | ORAL | 1 refills | Status: AC | PRN

## 2023-12-26 NOTE — Discharge Instructions (Addendum)
 Follow these instructions at home: Take all medicines only as told by your health care provider. If told, get Pap tests and regular exams of the pelvis. Do not smoke, vape, or use nicotine or tobacco. Return to your normal activities when your provider says that it's safe. Keep all follow-up visits. Your provider may want to check your cyst for 2-3 months to see if it changes. If you're in menopause, it's important to have your cyst checked closely because females in this age group have a higher rate of cancer of the ovaries. Contact a health care provider if: You have any new symptoms. Your symptoms get worse. Get help right away if: You have really bad pain in the belly or pelvis, and the pain comes on all of a sudden. You have heavy bleeding that soaks through more than one pad every hour.  [facility] [employer] for [employer] at [employer] [facility]             [address] [place], [place] [address] [phone]  Center for [facility] at [facility]                                                             [address], [address], [place], [place], [address] [phone]  Center for [facility] at [facility] [address], [address], [place], [place], [address] [phone]  Center for [facility] at [facility] [address], [place], [place], [address] [phone]  Center for [facility] at [facility]                                 [address] [place], [place], [place], [address] [phone]  Center for [facility] at [facility]                                    [address] [place], [place], [address] [phone]  Center for [facility] at [facility] [address], [place], [place], [address]                              [facility] [place] [address], [address], [place], [place],  [address] [phone]  [facility]         Phone: [phone]  [facility]      Phone: [phone]   [facility]      Phone: [phone]  [facility]         Phone: [phone]   [facility]    Phone: [phone]  [facility]      Phone: [phone]  [facility]     Phone: [phone]  [facility]        Phone: [phone]  [facility] [place])  [phone]  [facility]      Phone: [phone]   Safe Medications in Pregnancy   Acne:  Benzoyl Peroxide  Salicylic Acid   Backache/Headache:  Tylenol : 2 regular strength every 4 hours OR               2 Extra strength every 6 hours   Colds/Coughs/Allergies:  Benadryl (alcohol free) 25 mg every 6 hours as needed  Breath right strips  Claritin  Cepacol throat lozenges  Chloraseptic throat spray  Cold-Eeze- up to three times per day  Cough drops, alcohol free  Flonase (by prescription only)  Guaifenesin  Mucinex  Robitussin DM (plain only, alcohol free)  Saline nasal spray/drops  Sudafed (pseudoephedrine) & Actifed * use only after [redacted] weeks gestation and if you do not have high blood pressure  Tylenol   Vicks Vaporub  Zinc lozenges  Zyrtec   Constipation:  Colace  Ducolax suppositories  Fleet enema  Glycerin suppositories  Metamucil  Milk of magnesia  Miralax  Senokot  Smooth move tea   Diarrhea:  Kaopectate  Imodium A-D   *NO pepto Bismol   Hemorrhoids:  Anusol  Anusol HC  Preparation H  Tucks   Indigestion:  Tums  Maalox  Mylanta  Zantac  Pepcid   Insomnia:  Benadryl (alcohol free) 25mg  every 6 hours as needed  Tylenol  PM  Unisom, no Gelcaps   Leg Cramps:  Tums  MagGel   Nausea/Vomiting:  Bonine  Dramamine  Emetrol  Ginger extract  Sea  bands  Meclizine  Nausea medication to take during pregnancy:  Unisom (doxylamine succinate 25 mg tablets) Take one tablet daily at bedtime. If symptoms are not adequately controlled, the dose can be increased to a maximum recommended dose of two tablets daily (1/2 tablet in the morning, 1/2 tablet mid-afternoon and one at bedtime).  Vitamin B6 100mg  tablets. Take one tablet twice a day (up to 200 mg per day).   Skin Rashes:  Aveeno products  Benadryl cream or 25mg  every 6 hours as needed  Calamine Lotion  1% cortisone cream   Yeast infection:  Gyne-lotrimin 7  Monistat 7    **If taking multiple medications, please check labels to avoid duplicating the same active ingredients  **take medication as directed on the label  ** Do not exceed 4000 mg of tylenol  in 24 hours  **Do not take medications that contain aspirin or ibuprofen

## 2024-09-08 ENCOUNTER — Ambulatory Visit (HOSPITAL_COMMUNITY): Payer: Self-pay
